# Patient Record
Sex: Male | Born: 1955
Health system: Southern US, Community
[De-identification: ages and names within clinical notes are randomized; demographics above are authoritative.]

## PROBLEM LIST (undated history)

## (undated) DIAGNOSIS — E079 Disorder of thyroid, unspecified: Secondary | ICD-10-CM

## (undated) DIAGNOSIS — Z86010 Personal history of colonic polyps: Secondary | ICD-10-CM

## (undated) DIAGNOSIS — G4730 Sleep apnea, unspecified: Secondary | ICD-10-CM

## (undated) DIAGNOSIS — H359 Unspecified retinal disorder: Secondary | ICD-10-CM

## (undated) DIAGNOSIS — E119 Type 2 diabetes mellitus without complications: Secondary | ICD-10-CM

## (undated) DIAGNOSIS — K649 Unspecified hemorrhoids: Secondary | ICD-10-CM

## (undated) DIAGNOSIS — D649 Anemia, unspecified: Secondary | ICD-10-CM

## (undated) DIAGNOSIS — K219 Gastro-esophageal reflux disease without esophagitis: Secondary | ICD-10-CM

## (undated) DIAGNOSIS — E785 Hyperlipidemia, unspecified: Secondary | ICD-10-CM

## (undated) HISTORY — PX: HERNIA REPAIR: SHX51

## (undated) HISTORY — DX: Gastro-esophageal reflux disease without esophagitis: K21.9

## (undated) HISTORY — PX: COLONOSCOPY: SHX174

## (undated) HISTORY — PX: POLYPECTOMY: SHX149

## (undated) HISTORY — DX: Unspecified hemorrhoids: K64.9

## (undated) HISTORY — DX: Sleep apnea, unspecified: G47.30

## (undated) HISTORY — DX: Personal history of colonic polyps: Z86.010

## (undated) HISTORY — DX: Disorder of thyroid, unspecified: E07.9

## (undated) HISTORY — DX: Unspecified retinal disorder: H35.9

## (undated) HISTORY — DX: Type 2 diabetes mellitus without complications: E11.9

## (undated) HISTORY — PX: LUMBAR LAMINECTOMY: SHX95

## (undated) HISTORY — DX: Hyperlipidemia, unspecified: E78.5

## (undated) HISTORY — DX: Anemia, unspecified: D64.9

---

## 2001-10-08 ENCOUNTER — Encounter: Payer: Self-pay | Admitting: Pulmonary Disease

## 2001-11-03 ENCOUNTER — Encounter: Payer: Self-pay | Admitting: Pulmonary Disease

## 2001-12-11 ENCOUNTER — Encounter: Payer: Self-pay | Admitting: Pulmonary Disease

## 2001-12-11 ENCOUNTER — Ambulatory Visit (HOSPITAL_BASED_OUTPATIENT_CLINIC_OR_DEPARTMENT_OTHER): Admission: RE | Admit: 2001-12-11 | Discharge: 2001-12-11 | Payer: Self-pay | Admitting: Pulmonary Disease

## 2002-01-14 ENCOUNTER — Encounter: Payer: Self-pay | Admitting: Pulmonary Disease

## 2002-01-30 ENCOUNTER — Encounter: Admission: RE | Admit: 2002-01-30 | Discharge: 2002-01-30 | Payer: Self-pay | Admitting: Family Medicine

## 2002-01-30 ENCOUNTER — Encounter: Payer: Self-pay | Admitting: Family Medicine

## 2002-03-17 ENCOUNTER — Encounter: Payer: Self-pay | Admitting: Neurosurgery

## 2002-03-17 ENCOUNTER — Ambulatory Visit (HOSPITAL_COMMUNITY): Admission: RE | Admit: 2002-03-17 | Discharge: 2002-03-18 | Payer: Self-pay | Admitting: Neurosurgery

## 2003-09-07 ENCOUNTER — Ambulatory Visit (HOSPITAL_COMMUNITY): Admission: RE | Admit: 2003-09-07 | Discharge: 2003-09-08 | Payer: Self-pay | Admitting: Neurosurgery

## 2005-02-23 ENCOUNTER — Ambulatory Visit: Payer: Self-pay | Admitting: Internal Medicine

## 2005-03-09 ENCOUNTER — Ambulatory Visit: Payer: Self-pay | Admitting: Internal Medicine

## 2010-07-07 ENCOUNTER — Encounter: Payer: Self-pay | Admitting: Pulmonary Disease

## 2010-07-07 ENCOUNTER — Institutional Professional Consult (permissible substitution) (INDEPENDENT_AMBULATORY_CARE_PROVIDER_SITE_OTHER): Payer: 59 | Admitting: Pulmonary Disease

## 2010-07-07 DIAGNOSIS — G4733 Obstructive sleep apnea (adult) (pediatric): Secondary | ICD-10-CM | POA: Insufficient documentation

## 2010-07-07 DIAGNOSIS — E785 Hyperlipidemia, unspecified: Secondary | ICD-10-CM | POA: Insufficient documentation

## 2010-07-11 NOTE — Assessment & Plan Note (Signed)
Summary: consult for management of osa    Visit Type:  Initial Consult Copy to:  Mosetta Putt Primary Provider/Referring Provider:  Mosetta Putt.   CC:  Sleep Consult.  Former patient of Dr. Shelle Iron. Last seen 2003.  Pt states he has 2 cpap machines but they quit working recently.  Marland Kitchen  History of Present Illness: the pt is a 55y/o male who I have been asked to see for management of osa.  He has a known h/o severe osa from 2003, and was started on cpap successfully at 15cm of pressure.  He has not followed up with me since that time.  He has been wearing cpap compliantly and doing well, until his machine quit working about 3-4 weeks ago.  His machine is 57yrs old, and he is wearing the same mask from 3 yrs ago.  He is getting adequate sleep at night, and was adequately rested while on cpap. Since being off cpap, he has noted worsening sleep and increased daytime fatigue and sleepiness.  He tells me his weight is increased since his last visit.    Preventive Screening-Counseling & Management  Alcohol-Tobacco     Smoking Status: never  Current Medications (verified): 1)  Pravachol 40 Mg Tabs (Pravastatin Sodium) .... Take 1 Tablet By Mouth Once A Day 2)  Methylphenidate Hcl 20 Mg Tabs (Methylphenidate Hcl) .... Take 2 Tabs By Mouth Daily 3)  Citalopram Hydrobromide 40 Mg Tabs (Citalopram Hydrobromide) .... Take 1 Tablet By Mouth Once A Day 4)  Flomax 0.4 Mg Caps (Tamsulosin Hcl) .... Take 1 Tablet By Mouth Once A Day 5)  Multivitamins  Tabs (Multiple Vitamin) .... Take 1 Tablet By Mouth Once A Day  Allergies (verified): No Known Drug Allergies  Past History:  Past Medical History:  HYPERLIPIDEMIA (ICD-272.4) OBSTRUCTIVE SLEEP APNEA (ICD-327.23)--AHI 40/hr in 2003    Past Surgical History: back surgery LLL x 2 2004 and 2005 hernia surgery 1992  Family History: heart disease: father, paternal grandfather  Social History: Patient never smoked.  pt is married and lives with  wife, Orlie Pollen. pt has 1 child: a son pt works as a Administrator, [employer]. Smoking Status:  never  Review of Systems       The patient complains of shortness of breath with activity, productive cough, and nasal congestion/difficulty breathing through nose.  The patient denies shortness of breath at rest, non-productive cough, coughing up blood, chest pain, irregular heartbeats, acid heartburn, indigestion, loss of appetite, weight change, abdominal pain, difficulty swallowing, sore throat, tooth/dental problems, headaches, sneezing, itching, ear ache, anxiety, depression, hand/feet swelling, joint stiffness or pain, rash, change in color of mucus, and fever.    Vital Signs:  Patient profile:   55 year old male Height:      68 inches Weight:      236.50 pounds BMI:     36.09 O2 Sat:      94 % on Room air Temp:     98.4 degrees F oral Pulse rate:   109 / minute BP sitting:   130 / 84  (right arm) Cuff size:   large  Vitals Entered By: Arman Filter LPN (July 06, 1608 1:33 PM)  O2 Flow:  Room air CC: Sleep Consult.  Former patient of Dr. Shelle Iron. Last seen 2003.  Pt states he has 2 cpap machines but they quit working recently.   Comments Medications reviewed with patient Arman Filter LPN  July 07, 9602 1:37 PM    Physical Exam  General:  ow male in nad  Eyes:  PERRLA and EOMI.   Nose:  deviated  septum to left with mild narrowing Mouth:  elongation of soft palate and uvula  Neck:  no jvd, tmg, LN Lungs:  clear to auscultation Heart:  rrr, no mrg Abdomen:  soft and nontender, bs+ Extremities:  mild edema, no cyanosis  pulses intact distally Neurologic:  alert and oriented, moves all 4    Impression & Recommendations:  Problem # 1:  OBSTRUCTIVE SLEEP APNEA (ICD-327.23) the pt has a history of severe osa, and has not lost weight since last visit in 2008.  He has been compliant with his cpap, and has done well until it recently quit working.   He is way overdue for a  new mask and cpap machine.  Will get this set up thru his dme, and have encouraged him to work on weight loss.  He needs to f/u with me on a yearly basis, and as needed.   Medications Added to Medication List This Visit: 1)  Pravachol 40 Mg Tabs (Pravastatin sodium) .... Take 1 tablet by mouth once a day 2)  Methylphenidate Hcl 20 Mg Tabs (Methylphenidate hcl) .... Take 2 tabs by mouth daily 3)  Citalopram Hydrobromide 40 Mg Tabs (Citalopram hydrobromide) .... Take 1 tablet by mouth once a day 4)  Flomax 0.4 Mg Caps (Tamsulosin hcl) .... Take 1 tablet by mouth once a day 5)  Multivitamins Tabs (Multiple vitamin) .... Take 1 tablet by mouth once a day  Other Orders: Consultation Level IV (40347) DME Referral (DME)  Patient Instructions: 1)  will get you a new machine and mask, set at the same pressure. 2)  work on weight loss 3)  followup with me in one year, but call if having issues with cpap.

## 2010-07-18 NOTE — Progress Notes (Signed)
Summary: [facility]   Imported By: Sherian Rein 07/10/2010 07:18:50  _____________________________________________________________________  External Attachment:    Type:   Image     Comment:   External Document

## 2010-07-18 NOTE — Consult Note (Signed)
Summary: [facility]   Imported By: Sherian Rein 07/10/2010 07:17:47  _____________________________________________________________________  External Attachment:    Type:   Image     Comment:   External Document

## 2010-07-19 ENCOUNTER — Telehealth: Payer: Self-pay | Admitting: Pulmonary Disease

## 2010-07-19 NOTE — Telephone Encounter (Signed)
Pt called and states he has not been set up with his new cpap yet. Looks like order was sent 07/07/10. Pt states [facility] has not contacted him or anything.  Will send to pcc to see which [facility] order was sent to. Pt states he informed [facility] needed [facility] [place].  [place], [place]

## 2010-07-19 NOTE — Telephone Encounter (Signed)
msg per LED

## 2010-07-19 NOTE — Telephone Encounter (Signed)
Spoke to pt he is aware that order was re printed and given to [email or web address]  who is our lesion and she will notify the ws office asap [employer]

## 2015-04-11 ENCOUNTER — Encounter: Payer: Self-pay | Admitting: Internal Medicine

## 2015-05-18 MED FILL — METFORMIN HCL ER 500 MG TAB: 500 | 90 days supply | Qty: 180 | Fill #1

## 2015-07-07 MED FILL — CITALOPRAM HBR 40 MG TABLET: 40 | 90 days supply | Qty: 90 | Fill #3

## 2015-07-13 MED FILL — METADATE ER 20 MG TABLET: 20 | 90 days supply | Qty: 90 | Fill #0

## 2015-07-22 ENCOUNTER — Encounter: Payer: Self-pay | Admitting: Internal Medicine

## 2015-07-29 MED FILL — TAMSULOSIN HCL 0.4 MG CAP: 0.4 | 90 days supply | Qty: 90 | Fill #3

## 2015-08-24 MED FILL — PRAVASTATIN SODIUM 80 MG TA: 80 | 90 days supply | Qty: 90 | Fill #3

## 2015-09-30 ENCOUNTER — Ambulatory Visit (AMBULATORY_SURGERY_CENTER): Payer: Self-pay | Admitting: *Deleted

## 2015-09-30 VITALS — Ht 67.0 in | Wt 254.8 lb

## 2015-09-30 DIAGNOSIS — Z1211 Encounter for screening for malignant neoplasm of colon: Secondary | ICD-10-CM

## 2015-09-30 NOTE — Progress Notes (Signed)
No egg or soy allergy known to patient  No issues with past sedation with any surgeries  or procedures, no intubation problems  No diet pills per patient No home 02 use per patient  No blood thinners per patient  Pt denies issues with constipation  Pt is worried about OSA and his b ipap use.  He has a hemorrhoid his PCP suggested mentioning to [facility] about possible cautery to that with colon

## 2015-10-07 MED FILL — CITALOPRAM HBR 40 MG TABLET: 40 | 30 days supply | Qty: 30 | Fill #0

## 2015-10-10 ENCOUNTER — Telehealth: Payer: Self-pay | Admitting: Internal Medicine

## 2015-10-10 NOTE — Telephone Encounter (Signed)
Spoke with patient and we discussed in detail what foods he can have this week, what to avoid and prep instructions rediscussed with him  as well, when to mix, start Thursday and Friday. Questions asnwered Encouraged to call with further questions  Marijean Niemann

## 2015-10-14 ENCOUNTER — Encounter: Payer: Self-pay | Admitting: Internal Medicine

## 2015-10-14 ENCOUNTER — Ambulatory Visit (AMBULATORY_SURGERY_CENTER): Payer: 59 | Admitting: Internal Medicine

## 2015-10-14 VITALS — BP 100/51 | HR 68 | Temp 98.6°F | Resp 20 | Ht 67.0 in | Wt 254.0 lb

## 2015-10-14 DIAGNOSIS — Z1211 Encounter for screening for malignant neoplasm of colon: Secondary | ICD-10-CM

## 2015-10-14 DIAGNOSIS — E119 Type 2 diabetes mellitus without complications: Secondary | ICD-10-CM | POA: Diagnosis not present

## 2015-10-14 DIAGNOSIS — N400 Enlarged prostate without lower urinary tract symptoms: Secondary | ICD-10-CM | POA: Diagnosis not present

## 2015-10-14 DIAGNOSIS — D122 Benign neoplasm of ascending colon: Secondary | ICD-10-CM | POA: Diagnosis not present

## 2015-10-14 DIAGNOSIS — Z86010 Personal history of colonic polyps: Secondary | ICD-10-CM | POA: Insufficient documentation

## 2015-10-14 DIAGNOSIS — F329 Major depressive disorder, single episode, unspecified: Secondary | ICD-10-CM | POA: Diagnosis not present

## 2015-10-14 DIAGNOSIS — G4733 Obstructive sleep apnea (adult) (pediatric): Secondary | ICD-10-CM | POA: Diagnosis not present

## 2015-10-14 LAB — GLUCOSE, CAPILLARY
GLUCOSE-CAPILLARY: 72 mg/dL (ref 65–99)
GLUCOSE-CAPILLARY: 90 mg/dL (ref 65–99)
GLUCOSE-CAPILLARY: 126 mg/dL — AB (ref 65–99)

## 2015-10-14 MED ORDER — SODIUM CHLORIDE 0.9 % IV SOLN: 500.0000 mL | INTRAVENOUS | Status: DC

## 2015-10-14 NOTE — Progress Notes (Signed)
Glucose 90 after receiving D5.  Asymptomatic.

## 2015-10-14 NOTE — Op Note (Signed)
[place] Patient Name: Robert Hendricks Procedure Date: 10/14/2015 11:14 AM MRN: [id number] Endoscopist: Gatha Mayer , MD Age: 60 Referring MD:  Date of Birth: 01-12-1956 Gender: Male Procedure:                Colonoscopy Indications:              Screening for colorectal malignant neoplasm Medicines:                Monitored Anesthesia Care Procedure:                Pre-Anesthesia Assessment:                           - Prior to the procedure, a History and Physical                            was performed, and patient medications and                            allergies were reviewed. The patient's tolerance of                            previous anesthesia was also reviewed. The risks                            and benefits of the procedure and the sedation                            options and risks were discussed with the patient.                            All questions were answered, and informed consent                            was obtained. Prior Anticoagulants: The patient has                            taken aspirin, last dose was 4 days prior to                            procedure. ASA Grade Assessment: II - A patient                            with mild systemic disease. After reviewing the                            risks and benefits, the patient was deemed in                            satisfactory condition to undergo the procedure.                           After obtaining informed consent, the colonoscope  was passed under direct vision. Throughout the                            procedure, the patient's blood pressure, pulse, and                            oxygen saturations were monitored continuously. The                            Model CF-HQ190L [id number]) scope was introduced                            through the anus and advanced to the the cecum,                            identified by appendiceal orifice and  ileocecal                            valve. The ileocecal valve, appendiceal orifice,                            and rectum were photographed. The quality of the                            bowel preparation was excellent. Scope In: 11:22:07 AM Scope Out: 11:33:20 AM Scope Withdrawal Time: 0 hours 9 minutes 10 seconds  Total Procedure Duration: 0 hours 11 minutes 13 seconds  Findings:                 A 10 mm polyp was found in the ascending colon. The                            polyp was sessile. The polyp was removed with a                            cold snare. Resection and retrieval were complete.                           Multiple diverticula were found in the left colon.                           The exam was otherwise without abnormality on                            direct and retroflexion views. Complications:            No immediate complications. Estimated blood loss:                            None. Estimated Blood Loss:     Estimated blood loss was minimal. Impression:               - One 10 mm polyp in the ascending colon, removed  with a cold snare. Resected and retrieved.                           - Severe diverticulosis in the left colon.                           - The examination was otherwise normal on direct                            and retroflexion views. Recommendation:           - Patient has a contact number available for                            emergencies. The signs and symptoms of potential                            delayed complications were discussed with the                            patient. Return to normal activities tomorrow.                            Written discharge instructions were provided to the                            patient.                           - Resume previous diet.                           - Continue present medications.                           - Resume aspirin at prior dose today.                            - Repeat colonoscopy is recommended. The                            colonoscopy date will be determined after pathology                            results from today's exam become available for                            review. Gatha Mayer, MD 10/14/2015 11:52:15 AM This report has been signed electronically.

## 2015-10-14 NOTE — Patient Instructions (Addendum)
I found and removed one polyp - all else ok.  I will let you know pathology results and when to have another routine colonoscopy by mail.  I appreciate the opportunity to care for you. Gatha Mayer, MD, FACG  YOU HAD AN ENDOSCOPIC PROCEDURE TODAY AT [facility]:   Refer to the procedure report that was given to you for any specific questions about what was found during the examination.  If the procedure report does not answer your questions, please call your gastroenterologist to clarify.  If you requested that your care partner not be given the details of your procedure findings, then the procedure report has been included in a sealed envelope for you to review at your convenience later.  YOU SHOULD EXPECT: Some feelings of bloating in the abdomen. Passage of more gas than usual.  Walking can help get rid of the air that was put into your GI tract during the procedure and reduce the bloating. If you had a lower endoscopy (such as a colonoscopy or flexible sigmoidoscopy) you may notice spotting of blood in your stool or on the toilet paper. If you underwent a bowel prep for your procedure, you may not have a normal bowel movement for a few days.  Please Note:  You might notice some irritation and congestion in your nose or some drainage.  This is from the oxygen used during your procedure.  There is no need for concern and it should clear up in a day or so.  SYMPTOMS TO REPORT IMMEDIATELY:   Following lower endoscopy (colonoscopy or flexible sigmoidoscopy):  Excessive amounts of blood in the stool  Significant tenderness or worsening of abdominal pains  Swelling of the abdomen that is new, acute  Fever of 100F or higher  For urgent or emergent issues, a gastroenterologist can be reached at any hour by calling [phone].   DIET: Your first meal following the procedure should be a small meal and then it is ok to progress to your normal diet. Heavy or fried  foods are harder to digest and may make you feel nauseous or bloated.  Likewise, meals heavy in dairy and vegetables can increase bloating.  Drink plenty of fluids but you should avoid alcoholic beverages for 24 hours.  ACTIVITY:  You should plan to take it easy for the rest of today and you should NOT DRIVE or use heavy machinery until tomorrow (because of the sedation medicines used during the test).    FOLLOW UP: Our staff will call the number listed on your records the next business day following your procedure to check on you and address any questions or concerns that you may have regarding the information given to you following your procedure. If we do not reach you, we will leave a message.  However, if you are feeling well and you are not experiencing any problems, there is no need to return our call.  We will assume that you have returned to your regular daily activities without incident.  If any biopsies were taken you will be contacted by phone or by letter within the next 1-3 weeks.  Please call us at [phone] if you have not heard about the biopsies in 3 weeks.    SIGNATURES/CONFIDENTIALITY: You and/or your care partner have signed paperwork which will be entered into your electronic medical record.  These signatures attest to the fact that that the information above on your After Visit Summary has been reviewed and is  understood.  Full responsibility of the confidentiality of this discharge information lies with you and/or your care-partner.  Please read polyp and diverticulosis handouts provided.

## 2015-10-14 NOTE — Progress Notes (Signed)
Called to room to assist during endoscopic procedure.  Patient ID and intended procedure confirmed with present staff. Received instructions for my participation in the procedure from the performing physician.  

## 2015-10-14 NOTE — Progress Notes (Signed)
Report to PACU, RN, vss, BBS= Clear.  

## 2015-10-17 ENCOUNTER — Telehealth: Payer: Self-pay

## 2015-10-17 DIAGNOSIS — Z125 Encounter for screening for malignant neoplasm of prostate: Secondary | ICD-10-CM | POA: Diagnosis not present

## 2015-10-17 DIAGNOSIS — Z0000 Encounter for general adult medical examination without abnormal findings: Secondary | ICD-10-CM | POA: Diagnosis not present

## 2015-10-17 DIAGNOSIS — E7800 Pure hypercholesterolemia, unspecified: Secondary | ICD-10-CM | POA: Diagnosis not present

## 2015-10-17 DIAGNOSIS — E7801 Familial hypercholesterolemia: Secondary | ICD-10-CM | POA: Diagnosis not present

## 2015-10-17 NOTE — Telephone Encounter (Signed)
  Follow up Call-  Call back number 10/14/2015  Post procedure Call Back phone  # [phone]  Permission to leave phone message Yes    Patient was called for follow up after his procedure on 10/14/2015. No answer at the number given for follow up phone call. A message was left on the answering machine.

## 2015-10-28 DIAGNOSIS — F900 Attention-deficit hyperactivity disorder, predominantly inattentive type: Secondary | ICD-10-CM | POA: Diagnosis not present

## 2015-10-28 DIAGNOSIS — E785 Hyperlipidemia, unspecified: Secondary | ICD-10-CM | POA: Diagnosis not present

## 2015-10-28 DIAGNOSIS — F428 Other obsessive-compulsive disorder: Secondary | ICD-10-CM | POA: Diagnosis not present

## 2015-10-28 DIAGNOSIS — R7302 Impaired glucose tolerance (oral): Secondary | ICD-10-CM | POA: Diagnosis not present

## 2015-10-28 DIAGNOSIS — N401 Enlarged prostate with lower urinary tract symptoms: Secondary | ICD-10-CM | POA: Diagnosis not present

## 2015-10-28 DIAGNOSIS — R6882 Decreased libido: Secondary | ICD-10-CM | POA: Diagnosis not present

## 2015-10-28 DIAGNOSIS — G4733 Obstructive sleep apnea (adult) (pediatric): Secondary | ICD-10-CM | POA: Diagnosis not present

## 2015-10-28 DIAGNOSIS — Z0000 Encounter for general adult medical examination without abnormal findings: Secondary | ICD-10-CM | POA: Diagnosis not present

## 2015-10-28 DIAGNOSIS — Z008 Encounter for other general examination: Secondary | ICD-10-CM | POA: Diagnosis not present

## 2015-10-31 MED FILL — TAMSULOSIN HCL 0.4 MG CAP: 0.4 | 90 days supply | Qty: 90 | Fill #0

## 2015-10-31 MED FILL — CITALOPRAM HBR 40 MG TABLET: 40 | 90 days supply | Qty: 90 | Fill #0

## 2015-10-31 MED FILL — METFORMIN HCL ER 500 MG TAB: 500 | 30 days supply | Qty: 90 | Fill #0

## 2015-11-02 ENCOUNTER — Encounter: Payer: Self-pay | Admitting: Internal Medicine

## 2015-11-02 DIAGNOSIS — Z86010 Personal history of colonic polyps: Secondary | ICD-10-CM

## 2015-11-02 HISTORY — DX: Personal history of colonic polyps: Z86.010

## 2015-11-02 MED FILL — METADATE ER 20 MG TABLET: 20 | 30 days supply | Qty: 30 | Fill #0

## 2015-11-02 NOTE — Progress Notes (Signed)
Quick Note:  10 mm adenoma Recall 2020 ______

## 2015-11-10 DIAGNOSIS — E559 Vitamin D deficiency, unspecified: Secondary | ICD-10-CM | POA: Diagnosis not present

## 2015-11-10 DIAGNOSIS — N401 Enlarged prostate with lower urinary tract symptoms: Secondary | ICD-10-CM | POA: Diagnosis not present

## 2015-11-10 DIAGNOSIS — K0499 Other diseases of pulp and periapical tissues: Secondary | ICD-10-CM | POA: Diagnosis not present

## 2015-11-28 MED FILL — PRAVASTATIN SODIUM 80 MG TA: 80 | 30 days supply | Qty: 90 | Fill #0

## 2015-12-26 MED FILL — METADATE ER 20 MG TABLET: 20 | 30 days supply | Qty: 30 | Fill #0

## 2016-02-03 MED FILL — TAMSULOSIN HCL 0.4 MG CAP: 0.4 | 90 days supply | Qty: 90 | Fill #1

## 2016-02-03 MED FILL — METADATE ER 20 MG TABLET: 20 | 30 days supply | Qty: 30 | Fill #0

## 2016-02-14 MED FILL — CITALOPRAM HBR 40 MG TABLET: 40 | 90 days supply | Qty: 90 | Fill #1

## 2016-02-24 MED FILL — METFORMIN HCL ER 500 MG TAB: 500 | 30 days supply | Qty: 90 | Fill #1

## 2016-03-02 MED FILL — PRAVASTATIN SODIUM 80 MG TA: 80 | 30 days supply | Qty: 90 | Fill #1

## 2016-03-16 MED FILL — METHYLPHENIDATE ER 20 MG TA: 20 | 30 days supply | Qty: 30 | Fill #0

## 2016-04-05 DIAGNOSIS — L919 Hypertrophic disorder of the skin, unspecified: Secondary | ICD-10-CM | POA: Diagnosis not present

## 2016-04-05 DIAGNOSIS — L821 Other seborrheic keratosis: Secondary | ICD-10-CM | POA: Diagnosis not present

## 2016-04-05 DIAGNOSIS — L820 Inflamed seborrheic keratosis: Secondary | ICD-10-CM | POA: Diagnosis not present

## 2016-04-05 DIAGNOSIS — D225 Melanocytic nevi of trunk: Secondary | ICD-10-CM | POA: Diagnosis not present

## 2016-04-05 DIAGNOSIS — D1801 Hemangioma of skin and subcutaneous tissue: Secondary | ICD-10-CM | POA: Diagnosis not present

## 2016-05-08 MED FILL — TAMSULOSIN HCL 0.4 MG CAP: 0.4 | 90 days supply | Qty: 90 | Fill #2

## 2016-05-15 MED FILL — METHYLPHENIDATE ER 20 MG TA: 20 | 30 days supply | Qty: 30 | Fill #0

## 2016-05-18 MED FILL — CITALOPRAM HBR 40 MG TABLET: 40 | 90 days supply | Qty: 90 | Fill #2

## 2016-05-31 MED FILL — PRAVASTATIN SODIUM 80 MG TA: 80 | 90 days supply | Qty: 90 | Fill #2

## 2016-05-31 MED FILL — METFORMIN HCL ER 500 MG TAB: 500 | 30 days supply | Qty: 90 | Fill #2

## 2016-06-07 DIAGNOSIS — Z23 Encounter for immunization: Secondary | ICD-10-CM | POA: Diagnosis not present

## 2016-06-29 MED FILL — METHYLPHENIDATE ER 20 MG TA: 20 | 30 days supply | Qty: 30 | Fill #0

## 2016-08-07 MED FILL — METHYLPHENIDATE ER 20 MG TA: 20 | 30 days supply | Qty: 30 | Fill #0

## 2016-08-07 MED FILL — TAMSULOSIN HCL 0.4 MG CAP: 0.4 | 90 days supply | Qty: 90 | Fill #3

## 2016-08-17 MED FILL — CITALOPRAM HBR 40 MG TABLET: 40 | 90 days supply | Qty: 90 | Fill #3

## 2016-08-31 MED FILL — METFORMIN HCL ER 500 MG TAB: 500 | 30 days supply | Qty: 90 | Fill #3

## 2016-09-03 MED FILL — PRAVASTATIN SODIUM 80 MG TA: 80 | 90 days supply | Qty: 90 | Fill #3

## 2016-09-19 MED FILL — METHYLPHENIDATE ER 20 MG TA: 20 | 30 days supply | Qty: 30 | Fill #0

## 2016-10-24 DIAGNOSIS — Z125 Encounter for screening for malignant neoplasm of prostate: Secondary | ICD-10-CM | POA: Diagnosis not present

## 2016-10-24 DIAGNOSIS — Z0000 Encounter for general adult medical examination without abnormal findings: Secondary | ICD-10-CM | POA: Diagnosis not present

## 2016-11-02 DIAGNOSIS — I872 Venous insufficiency (chronic) (peripheral): Secondary | ICD-10-CM | POA: Diagnosis not present

## 2016-11-02 DIAGNOSIS — Z1211 Encounter for screening for malignant neoplasm of colon: Secondary | ICD-10-CM | POA: Diagnosis not present

## 2016-11-02 DIAGNOSIS — Z23 Encounter for immunization: Secondary | ICD-10-CM | POA: Diagnosis not present

## 2016-11-02 DIAGNOSIS — Z0001 Encounter for general adult medical examination with abnormal findings: Secondary | ICD-10-CM | POA: Diagnosis not present

## 2016-11-02 DIAGNOSIS — E7800 Pure hypercholesterolemia, unspecified: Secondary | ICD-10-CM | POA: Diagnosis not present

## 2016-11-02 DIAGNOSIS — R7302 Impaired glucose tolerance (oral): Secondary | ICD-10-CM | POA: Diagnosis not present

## 2016-11-02 DIAGNOSIS — E039 Hypothyroidism, unspecified: Secondary | ICD-10-CM | POA: Diagnosis not present

## 2016-11-02 DIAGNOSIS — G4733 Obstructive sleep apnea (adult) (pediatric): Secondary | ICD-10-CM | POA: Diagnosis not present

## 2016-11-02 DIAGNOSIS — Z008 Encounter for other general examination: Secondary | ICD-10-CM | POA: Diagnosis not present

## 2016-11-02 DIAGNOSIS — N401 Enlarged prostate with lower urinary tract symptoms: Secondary | ICD-10-CM | POA: Diagnosis not present

## 2016-11-05 MED FILL — METFORMIN HCL ER 500 MG TAB: 500 | 30 days supply | Qty: 90 | Fill #0

## 2016-11-05 MED FILL — METHYLPHENIDATE ER 20 MG TA: 20 | 90 days supply | Qty: 90 | Fill #0

## 2016-11-05 MED FILL — PANTOPRAZOLE SOD DR 40 MG T: 40 | 90 days supply | Qty: 90 | Fill #0

## 2016-11-12 MED FILL — TAMSULOSIN HCL 0.4 MG CAP: 0.4 | 90 days supply | Qty: 90 | Fill #0

## 2016-11-12 MED FILL — CITALOPRAM HBR 40 MG TABLET: 40 | 90 days supply | Qty: 90 | Fill #0

## 2016-11-26 MED FILL — PRAVASTATIN SODIUM 80 MG TA: 80 | 90 days supply | Qty: 90 | Fill #0

## 2017-01-15 DIAGNOSIS — E039 Hypothyroidism, unspecified: Secondary | ICD-10-CM | POA: Diagnosis not present

## 2017-02-06 MED FILL — PANTOPRAZOLE SOD DR 40 MG T: 40 | 90 days supply | Qty: 90 | Fill #1

## 2017-02-12 MED FILL — TAMSULOSIN HCL 0.4 MG CAP: 0.4 | 90 days supply | Qty: 90 | Fill #1

## 2017-02-12 MED FILL — CITALOPRAM HBR 40 MG TABLET: 40 | 90 days supply | Qty: 90 | Fill #1

## 2017-03-04 MED FILL — METFORMIN HCL ER 500 MG TAB: 500 | 30 days supply | Qty: 90 | Fill #1

## 2017-03-06 MED FILL — PRAVASTATIN SODIUM 80 MG TA: 80 | 90 days supply | Qty: 90 | Fill #1

## 2017-03-20 MED FILL — METHYLPHENIDATE ER 20 MG TA: 20 | 90 days supply | Qty: 90 | Fill #0

## 2017-04-24 DIAGNOSIS — D1801 Hemangioma of skin and subcutaneous tissue: Secondary | ICD-10-CM | POA: Diagnosis not present

## 2017-04-24 DIAGNOSIS — L918 Other hypertrophic disorders of the skin: Secondary | ICD-10-CM | POA: Diagnosis not present

## 2017-05-15 MED FILL — PANTOPRAZOLE SOD DR 40 MG T: 40 | 90 days supply | Qty: 90 | Fill #2

## 2017-05-15 MED FILL — CITALOPRAM HBR 40 MG TABLET: 40 | 90 days supply | Qty: 90 | Fill #2

## 2017-05-15 MED FILL — TAMSULOSIN HCL 0.4 MG CAP: 0.4 | 90 days supply | Qty: 90 | Fill #2

## 2017-06-12 MED FILL — METFORMIN HCL ER 500 MG TAB: 500 | 30 days supply | Qty: 90 | Fill #2

## 2017-06-12 MED FILL — PRAVASTATIN SODIUM 80 MG TA: 80 | 90 days supply | Qty: 90 | Fill #2

## 2017-07-12 DIAGNOSIS — Z23 Encounter for immunization: Secondary | ICD-10-CM | POA: Diagnosis not present

## 2017-08-05 MED FILL — METHYLPHENIDATE ER 20 MG TA: 20 | 90 days supply | Qty: 90 | Fill #0

## 2017-08-15 MED FILL — PANTOPRAZOLE SOD DR 40 MG T: 40 | 90 days supply | Qty: 90 | Fill #3

## 2017-08-20 MED FILL — CITALOPRAM HBR 40 MG TABLET: 40 | 90 days supply | Qty: 90 | Fill #3

## 2017-08-20 MED FILL — TAMSULOSIN HCL 0.4 MG CAP: 0.4 | 90 days supply | Qty: 90 | Fill #3

## 2017-08-27 DIAGNOSIS — J019 Acute sinusitis, unspecified: Secondary | ICD-10-CM | POA: Diagnosis not present

## 2017-08-27 DIAGNOSIS — J209 Acute bronchitis, unspecified: Secondary | ICD-10-CM | POA: Diagnosis not present

## 2017-08-27 MED FILL — HYDROCODONE-CHLORPHENIRAM S: 10-8 | 6 days supply | Qty: 60 | Fill #0

## 2017-08-27 MED FILL — AZITHROMYCIN 250 MG TABS: 250 | 5 days supply | Qty: 6 | Fill #0

## 2017-08-30 MED FILL — PROAIR RESPICLICK INHAL PWD: 108 (90 BAS | 16 days supply | Qty: 1 | Fill #0

## 2017-09-16 MED FILL — PRAVASTATIN SODIUM 80 MG TA: 80 | 90 days supply | Qty: 90 | Fill #3

## 2017-09-16 MED FILL — METFORMIN HCL ER 500 MG TAB: 500 | 30 days supply | Qty: 90 | Fill #3

## 2017-10-28 DIAGNOSIS — Z1159 Encounter for screening for other viral diseases: Secondary | ICD-10-CM | POA: Diagnosis not present

## 2017-10-28 DIAGNOSIS — Z0000 Encounter for general adult medical examination without abnormal findings: Secondary | ICD-10-CM | POA: Diagnosis not present

## 2017-11-08 MED FILL — LEVOTHYROXINE 100 MCG TAB: 100 | 30 days supply | Qty: 30 | Fill #0

## 2017-11-08 MED FILL — TAMSULOSIN HCL 0.4 MG CAP: 0.4 | 90 days supply | Qty: 180 | Fill #0

## 2017-11-08 MED FILL — METFORMIN HCL ER 500 MG TAB: 500 | 90 days supply | Qty: 90 | Fill #0

## 2017-11-08 MED FILL — CITALOPRAM HBR 40 MG TABLET: 40 | 90 days supply | Qty: 90 | Fill #0

## 2017-11-08 MED FILL — PANTOPRAZOLE SOD DR 40 MG T: 40 | 90 days supply | Qty: 90 | Fill #0

## 2017-12-02 MED FILL — PRAVASTATIN SODIUM 80 MG TA: 80 | 90 days supply | Qty: 90 | Fill #0

## 2017-12-02 MED FILL — METHYLPHENIDATE ER 20 MG TA: 20 | 90 days supply | Qty: 90 | Fill #0

## 2017-12-09 MED FILL — LEVOTHYROXINE 100 MCG TAB: 100 | 30 days supply | Qty: 30 | Fill #1

## 2017-12-16 DIAGNOSIS — E039 Hypothyroidism, unspecified: Secondary | ICD-10-CM | POA: Diagnosis not present

## 2017-12-20 MED FILL — LEVOTHYROXINE 88 MCG TABLET: 88 | 30 days supply | Qty: 30 | Fill #0

## 2018-01-17 MED FILL — LEVOTHYROXINE 88 MCG TABLET: 88 | 30 days supply | Qty: 30 | Fill #1

## 2018-02-17 DIAGNOSIS — E039 Hypothyroidism, unspecified: Secondary | ICD-10-CM | POA: Diagnosis not present

## 2018-02-18 MED FILL — LEVOTHYROXINE 75 MCG TABLET: 75 | 30 days supply | Qty: 30 | Fill #0

## 2018-02-24 MED FILL — PANTOPRAZOLE SOD DR 40 MG T: 40 | 90 days supply | Qty: 90 | Fill #1

## 2018-02-24 MED FILL — CITALOPRAM HBR 40 MG TABLET: 40 | 90 days supply | Qty: 90 | Fill #1

## 2018-03-19 MED FILL — metFORMIN HCL ER 500 MG TB2: 500 | 90 days supply | Qty: 90 | Fill #1

## 2018-03-19 MED FILL — LEVOTHYROXINE 75 MCG TABLET: 75 | 30 days supply | Qty: 30 | Fill #1

## 2018-03-24 MED FILL — PRAVASTATIN SODIUM 80 MG TA: 80 | 90 days supply | Qty: 90 | Fill #1

## 2018-04-01 DIAGNOSIS — M7130 Other bursal cyst, unspecified site: Secondary | ICD-10-CM | POA: Diagnosis not present

## 2018-04-01 DIAGNOSIS — L821 Other seborrheic keratosis: Secondary | ICD-10-CM | POA: Diagnosis not present

## 2018-04-01 DIAGNOSIS — D1801 Hemangioma of skin and subcutaneous tissue: Secondary | ICD-10-CM | POA: Diagnosis not present

## 2018-04-01 DIAGNOSIS — B078 Other viral warts: Secondary | ICD-10-CM | POA: Diagnosis not present

## 2018-04-10 MED FILL — METHYLPHENIDATE HCL ER 20 M: 20 | 90 days supply | Qty: 90 | Fill #0

## 2018-04-15 DIAGNOSIS — E039 Hypothyroidism, unspecified: Secondary | ICD-10-CM | POA: Diagnosis not present

## 2018-04-18 MED FILL — LEVOTHYROXINE 75 MCG TABLET: 75 | 90 days supply | Qty: 90 | Fill #0

## 2018-05-26 MED FILL — TAMSULOSIN HCL 0.4 MG CAP: 0.4 | 90 days supply | Qty: 180 | Fill #1

## 2018-05-26 MED FILL — CITALOPRAM HBR 40 MG TABLET: 40 | 90 days supply | Qty: 90 | Fill #2

## 2018-05-26 MED FILL — PANTOPRAZOLE SOD DR 40 MG T: 40 | 90 days supply | Qty: 90 | Fill #2

## 2018-06-18 MED FILL — metFORMIN HCL ER 500 MG TB2: 500 | 90 days supply | Qty: 90 | Fill #2

## 2018-06-25 MED FILL — PRAVASTATIN SODIUM 80 MG TA: 80 | 90 days supply | Qty: 90 | Fill #2

## 2018-07-14 MED FILL — LEVOTHYROXINE 75 MCG TABLET: 75 | 90 days supply | Qty: 90 | Fill #1

## 2018-08-26 MED FILL — PANTOPRAZOLE SOD DR 40 MG T: 40 | 90 days supply | Qty: 90 | Fill #3

## 2018-08-26 MED FILL — CITALOPRAM HBR 40 MG TABLET: 40 | 90 days supply | Qty: 90 | Fill #3

## 2018-08-27 MED FILL — METHYLPHENIDATE HCL ER 20 M: 20 | 90 days supply | Qty: 90 | Fill #0

## 2018-09-19 ENCOUNTER — Encounter: Payer: Self-pay | Admitting: Internal Medicine

## 2018-09-23 MED FILL — metFORMIN HCL ER 500 MG TB2: 500 | 90 days supply | Qty: 90 | Fill #3

## 2018-09-30 MED FILL — PRAVASTATIN SODIUM 80 MG TA: 80 | 90 days supply | Qty: 90 | Fill #3

## 2018-10-16 MED FILL — LEVOTHYROXINE 75 MCG TABLET: 75 | 90 days supply | Qty: 90 | Fill #2

## 2018-11-19 DIAGNOSIS — Z125 Encounter for screening for malignant neoplasm of prostate: Secondary | ICD-10-CM | POA: Diagnosis not present

## 2018-11-19 DIAGNOSIS — Z0000 Encounter for general adult medical examination without abnormal findings: Secondary | ICD-10-CM | POA: Diagnosis not present

## 2018-11-26 DIAGNOSIS — E7800 Pure hypercholesterolemia, unspecified: Secondary | ICD-10-CM | POA: Diagnosis not present

## 2018-11-26 DIAGNOSIS — I10 Essential (primary) hypertension: Secondary | ICD-10-CM | POA: Diagnosis not present

## 2018-11-26 DIAGNOSIS — F900 Attention-deficit hyperactivity disorder, predominantly inattentive type: Secondary | ICD-10-CM | POA: Diagnosis not present

## 2018-11-26 DIAGNOSIS — N401 Enlarged prostate with lower urinary tract symptoms: Secondary | ICD-10-CM | POA: Diagnosis not present

## 2018-11-26 DIAGNOSIS — Z0000 Encounter for general adult medical examination without abnormal findings: Secondary | ICD-10-CM | POA: Diagnosis not present

## 2018-11-26 DIAGNOSIS — R7302 Impaired glucose tolerance (oral): Secondary | ICD-10-CM | POA: Diagnosis not present

## 2018-11-26 DIAGNOSIS — I872 Venous insufficiency (chronic) (peripheral): Secondary | ICD-10-CM | POA: Diagnosis not present

## 2018-11-26 DIAGNOSIS — G4700 Insomnia, unspecified: Secondary | ICD-10-CM | POA: Diagnosis not present

## 2018-11-27 MED FILL — TAMSULOSIN HCL 0.4 MG CAP: 0.4 | 90 days supply | Qty: 90 | Fill #0

## 2018-11-27 MED FILL — METHYLPHENIDATE HCL ER 20 M: 20 | 90 days supply | Qty: 90 | Fill #0

## 2018-11-27 MED FILL — TRIAMTERENE/HCTZ 37.5/25 TB: 37.5-25 | 90 days supply | Qty: 90 | Fill #0

## 2018-11-27 MED FILL — CITALOPRAM HBR 40 MG TABLET: 40 | 90 days supply | Qty: 90 | Fill #0

## 2018-11-27 MED FILL — PANTOPRAZOLE SOD DR 40 MG T: 40 | 90 days supply | Qty: 90 | Fill #0

## 2018-11-27 MED FILL — ROSUVASTATIN CALCIUM 20 MG: 20 | 90 days supply | Qty: 90 | Fill #0

## 2018-11-27 MED FILL — FINASTERIDE 5 MG TABLET: 5 | 90 days supply | Qty: 90 | Fill #0

## 2018-12-08 DIAGNOSIS — I10 Essential (primary) hypertension: Secondary | ICD-10-CM | POA: Diagnosis not present

## 2018-12-18 MED FILL — metFORMIN HCL ER 500 MG TB2: 500 | 10 days supply | Qty: 30 | Fill #0

## 2018-12-19 ENCOUNTER — Encounter: Payer: Self-pay | Admitting: Pulmonary Disease

## 2018-12-19 ENCOUNTER — Other Ambulatory Visit: Payer: Self-pay

## 2018-12-19 ENCOUNTER — Ambulatory Visit: Payer: 59 | Admitting: Pulmonary Disease

## 2018-12-19 VITALS — BP 146/76 | HR 94 | Temp 97.3°F | Ht 67.0 in | Wt 264.6 lb

## 2018-12-19 DIAGNOSIS — G4733 Obstructive sleep apnea (adult) (pediatric): Secondary | ICD-10-CM

## 2018-12-19 DIAGNOSIS — G4730 Sleep apnea, unspecified: Secondary | ICD-10-CM

## 2018-12-19 DIAGNOSIS — E669 Obesity, unspecified: Secondary | ICD-10-CM

## 2018-12-19 NOTE — Addendum Note (Signed)
Addended by: Jannette Spanner on: 12/19/2018 12:32 PM   Modules accepted: Orders

## 2018-12-19 NOTE — Progress Notes (Signed)
[facility]  Chief Complaint  Patient presents with  . Sleep Consult    Constitutional:  BP (!) 146/76 (BP Location: Right Arm, Cuff Size: Large)   Pulse 94   Temp (!) 97.3 F (36.3 C) (Oral)   Ht 5\' 7"  (1.702 m)   Wt 264 lb 9.6 oz (120 kg)   SpO2 97%   BMI 41.44 kg/m   Past Medical History:  HLD, Colon polyp, DM, Anemia, Hypothyroidism, OCD  Brief Summary:  Robert Hendricks is a 63 y.o. male with obstructive sleep apnea.  Previously seen by Dr. Gwenette Greet.  Sleeps study showed severe OSA.  Started on CPAP 15 cm H2O.  Current machine is about 65 yrs old.  He is concerned that it is no longer working.  He would buy his supplies from [facility] on his own and get a new mask 1 time per year.  His wife thinks he is snoring more even when using CPAP.  His machine reads high pressure leak.  He has gained weight since his original sleep study.  He was also getting leg swelling and started on HCTZ by PCP.  He goes to sleep at 1215 am.  He falls asleep quickly.  He wakes up 2 times to use the bathroom.  He gets out of bed at 7 am.  He feels okay in the morning.  He denies morning headache.  He does not use anything to help him fall sleep.  Has been using ritalin for past several years to help with OCD.  He denies sleep walking, sleep talking, bruxism, or nightmares.  There is no history of restless legs.  He denies sleep hallucinations, sleep paralysis, or cataplexy.  The Epworth score is 3 out of 24.     Physical Exam:   Appearance - well kempt   ENMT - clear nasal mucosa, deviated nasal  septum, no oral exudates, no LAN, trachea midline  Respiratory - normal chest wall, normal respiratory effort, no accessory muscle use, no wheeze/rales  CV - s1s2 regular rate and rhythm, no murmurs, no peripheral edema, radial pulses symmetric  GI - soft, non tender, no masses  Lymph - no adenopathy noted in neck and axillary areas  MSK -  normal gait  Ext - no cyanosis, clubbing, or joint inflammation noted  Skin - no rashes, lesions, or ulcers  Neuro - normal strength, oriented x 3  Psych - normal mood and affect  Discussion:  He has history of obstructive sleep apnea.  He has gained weight and developed leg swelling since original diagnosis.  His machine is more than 65 years old, seems to not be working, and wouldn't be able to be repaired.  It is uncertain whether his current issues are related to needing updated equipment, progression of sleep apnea, or both.  Assessment/Plan:   Snoring with excessive daytime sleepiness with history of obstructive sleep apnea. - will need to arrange for a home sleep study - after results will then arrange for new auto CPAP and overnight oximetry on this set up, and then determine if he needs in lab titration study  Obesity. - discussed how weight can impact sleep and risk for sleep disordered breathing - discussed options to assist with weight loss: combination of diet modification, cardiovascular and strength training exercises  Cardiovascular risk. - had an extensive discussion regarding the adverse health consequences related to untreated sleep disordered breathing - specifically discussed the risks for hypertension, coronary artery disease,  cardiac dysrhythmias, cerebrovascular disease, and diabetes - lifestyle modification discussed  Safe driving practices. - discussed how sleep disruption can increase risk of accidents, particularly when driving - safe driving practices were discussed   Patient Instructions  Will arrange for home sleep study  Follow up in 3 months    Chesley Mires, MD [place] Pager: [phone] 12/19/2018, 9:57 AM  Flow Sheet    Sleep tests:  PSG 10/08/01 >> RDI 40, SpO2 low 79%   Review of Systems:  Reviewed and negative.  Medications:   Allergies as of 12/19/2018   No Known Allergies     Medication List        Accurate as of December 19, 2018  9:57 AM. If you have any questions, ask your nurse or doctor.        aspirin 81 MG chewable tablet Chew 81 mg by mouth daily.   cholecalciferol 1000 units tablet Commonly known as: VITAMIN D Take 1,000 Units by mouth daily.   citalopram 40 MG tablet Commonly known as: CELEXA Take 40 mg by mouth daily.   hydrochlorothiazide 12.5 MG capsule Commonly known as: MICROZIDE Take 12.5 mg by mouth daily.   levothyroxine 75 MCG tablet Commonly known as: SYNTHROID Take 75 mcg by mouth daily before breakfast.   metFORMIN 500 MG tablet Commonly known as: GLUCOPHAGE Take 500 mg by mouth daily with breakfast.   methylphenidate 20 MG tablet Commonly known as: RITALIN Take 20 mg by mouth daily.   pravastatin 80 MG tablet Commonly known as: PRAVACHOL Take 80 mg by mouth daily.   tamsulosin 0.4 MG Caps capsule Commonly known as: FLOMAX Take 0.4 mg by mouth daily.       Past Surgical History:  He  has a past surgical history that includes Hernia repair; Lumbar laminectomy [id number]); and Colonoscopy (2006, 2017).  Family History:  His family history is not on file.  Social History:  He  reports that he has never smoked. He has never used smokeless tobacco. He reports current alcohol use. He reports that he does not use drugs.

## 2018-12-19 NOTE — Patient Instructions (Signed)
Will arrange for home sleep study   Follow up in 3 months 

## 2018-12-19 NOTE — Addendum Note (Signed)
Addended by: Jannette Spanner on: 12/19/2018 10:07 AM   Modules accepted: Orders

## 2018-12-31 ENCOUNTER — Other Ambulatory Visit: Payer: Self-pay

## 2018-12-31 ENCOUNTER — Ambulatory Visit: Payer: 59

## 2018-12-31 DIAGNOSIS — G4733 Obstructive sleep apnea (adult) (pediatric): Secondary | ICD-10-CM

## 2019-01-01 DIAGNOSIS — G4733 Obstructive sleep apnea (adult) (pediatric): Secondary | ICD-10-CM | POA: Diagnosis not present

## 2019-01-06 ENCOUNTER — Telehealth: Payer: Self-pay | Admitting: Pulmonary Disease

## 2019-01-06 DIAGNOSIS — G4733 Obstructive sleep apnea (adult) (pediatric): Secondary | ICD-10-CM

## 2019-01-06 DIAGNOSIS — G4734 Idiopathic sleep related nonobstructive alveolar hypoventilation: Secondary | ICD-10-CM

## 2019-01-06 NOTE — Telephone Encounter (Signed)
Spoke with patient. He is aware of HST results and wishes to proceed with the new cpap machine. Advised him of the process, he verbalized understanding.   Patient wants to hold off on scheduling an office visit for now. He will call back to get scheduled. Will also place a personal reminder on his chart to follow up with him in a few weeks.   Orders will be placed for the CPAP and ONO.   Nothing further needed at time of call.

## 2019-01-06 NOTE — Telephone Encounter (Signed)
HST 01/01/19 >> AHI 57.3, SpO2 low 76%   Please inform him that his sleep study shows very severe obstructive sleep apnea.    Please arrange for new auto CPAP with pressure range 5 to 20 cm H2O with heated humidity and mask of choice.  He will need overnight oximetry while using auto CPAP.  Please schedule ROV in 2 months after getting new CPAP machine.

## 2019-01-12 MED FILL — LEVOTHYROXINE 75 MCG TABLET: 75 | 90 days supply | Qty: 90 | Fill #0

## 2019-01-13 DIAGNOSIS — R0902 Hypoxemia: Secondary | ICD-10-CM | POA: Diagnosis not present

## 2019-01-13 DIAGNOSIS — J449 Chronic obstructive pulmonary disease, unspecified: Secondary | ICD-10-CM | POA: Diagnosis not present

## 2019-01-19 ENCOUNTER — Telehealth: Payer: Self-pay | Admitting: Pulmonary Disease

## 2019-01-19 NOTE — Telephone Encounter (Signed)
lmtcb for pt.  Wcb.

## 2019-01-19 NOTE — Telephone Encounter (Signed)
ONO with CPAP 01/13/19 >> test time 6 hrs 22 min.  Baseline SpO2 93%, low SpO2 85%.  Spent 3 min 36 sec with SpO2 < 88%.   Please let him know oxygen level looked good with CPAP at night.  He should continue using CPAP.

## 2019-01-20 MED FILL — metFORMIN HCL ER 500 MG TB2: 500 | 30 days supply | Qty: 30 | Fill #1

## 2019-01-20 NOTE — Telephone Encounter (Signed)
Pt return called and would like call back

## 2019-01-20 NOTE — Telephone Encounter (Signed)
ATC pt, line went to voicemail. LMTCB x2.  

## 2019-01-20 NOTE — Telephone Encounter (Signed)
Called and spoke w/ pt regarding VS's results and recommendations. Pt verbalized understanding and is inquiring if VS can get an order placed for a new CPAP machine and equipment as his is 12-65 years old. I let him know there is an order placed 01/06/2019 for new CPAP machine; however, additional information (I.e. his sleep test and ONO) may have been needed for DME.   VS, please advise if you are ok with [place] placing an order for new CPAP machine and supplies for pt. Thank you.

## 2019-01-21 NOTE — Telephone Encounter (Signed)
An order was placed on 01/06/19 for new auto CPAP.  He had home sleep study on 01/01/19.  He had face to face visit on 12/19/18 prior to scheduling home sleep study.  This should be sufficient information to arrange for new auto CPAP machine.  Please have [facility] check with [facility] why new auto CPAP hasn't been arranged yet.

## 2019-01-21 NOTE — Telephone Encounter (Signed)
I am calling to check on this

## 2019-01-21 NOTE — Telephone Encounter (Signed)
Called [facility] & spoke to [place].  Somehow this order got voided and stated new machine was received in Feb 2020.  Sonia Baller was able to see that cpap was received in March 2012.  She put a note on the order and sent it back to respiratory dept for pt to be set up and she said she would have someone to call pt today.  I called pt & left him a vm to let him know they would be calling him today & left him my # in case he needs to contact me.  Nothing further needed.

## 2019-02-03 DIAGNOSIS — G4733 Obstructive sleep apnea (adult) (pediatric): Secondary | ICD-10-CM | POA: Diagnosis not present

## 2019-02-10 DIAGNOSIS — E785 Hyperlipidemia, unspecified: Secondary | ICD-10-CM | POA: Diagnosis not present

## 2019-02-17 DIAGNOSIS — N401 Enlarged prostate with lower urinary tract symptoms: Secondary | ICD-10-CM | POA: Diagnosis not present

## 2019-02-17 DIAGNOSIS — E7800 Pure hypercholesterolemia, unspecified: Secondary | ICD-10-CM | POA: Diagnosis not present

## 2019-02-17 DIAGNOSIS — I872 Venous insufficiency (chronic) (peripheral): Secondary | ICD-10-CM | POA: Diagnosis not present

## 2019-02-17 DIAGNOSIS — I10 Essential (primary) hypertension: Secondary | ICD-10-CM | POA: Diagnosis not present

## 2019-02-18 MED FILL — POTASSIUM CHLORIDE CRYS ER: 20 | 90 days supply | Qty: 90 | Fill #0

## 2019-02-18 MED FILL — ROSUVASTATIN CALCIUM 40 MG: 40 | 90 days supply | Qty: 90 | Fill #0

## 2019-02-18 MED FILL — CHLORTHALIDONE 50 MG TABLET: 50 | 90 days supply | Qty: 90 | Fill #0

## 2019-02-26 MED FILL — TAMSULOSIN HCL 0.4 MG CAP: 0.4 | 90 days supply | Qty: 90 | Fill #1

## 2019-02-26 MED FILL — metFORMIN HCL ER 500 MG TB2: 500 | 30 days supply | Qty: 30 | Fill #2

## 2019-02-26 MED FILL — FINASTERIDE 5 MG TABLET: 5 | 90 days supply | Qty: 90 | Fill #1

## 2019-02-26 MED FILL — CITALOPRAM HBR 40 MG TABLET: 40 | 90 days supply | Qty: 90 | Fill #1

## 2019-03-02 MED FILL — PANTOPRAZOLE SOD DR 40 MG T: 40 | 90 days supply | Qty: 90 | Fill #1

## 2019-03-03 DIAGNOSIS — I10 Essential (primary) hypertension: Secondary | ICD-10-CM | POA: Diagnosis not present

## 2019-03-06 DIAGNOSIS — G4733 Obstructive sleep apnea (adult) (pediatric): Secondary | ICD-10-CM | POA: Diagnosis not present

## 2019-03-17 DIAGNOSIS — E876 Hypokalemia: Secondary | ICD-10-CM | POA: Diagnosis not present

## 2019-03-27 MED FILL — POTASSIUM CHLORIDE CRYS ER: 20 MEQ | 90 days supply | Qty: 180 | Fill #0

## 2019-03-27 MED FILL — METFORMIN HCL ER 500 MG TB2: 500 | 90 days supply | Qty: 90 | Fill #3

## 2019-04-05 DIAGNOSIS — G4733 Obstructive sleep apnea (adult) (pediatric): Secondary | ICD-10-CM | POA: Diagnosis not present

## 2019-04-07 DIAGNOSIS — D1801 Hemangioma of skin and subcutaneous tissue: Secondary | ICD-10-CM | POA: Diagnosis not present

## 2019-04-07 DIAGNOSIS — L918 Other hypertrophic disorders of the skin: Secondary | ICD-10-CM | POA: Diagnosis not present

## 2019-04-07 DIAGNOSIS — D225 Melanocytic nevi of trunk: Secondary | ICD-10-CM | POA: Diagnosis not present

## 2019-04-13 MED FILL — LEVOTHYROXINE 75 MCG TABLET: 75 | 90 days supply | Qty: 90 | Fill #1

## 2019-05-06 DIAGNOSIS — G4733 Obstructive sleep apnea (adult) (pediatric): Secondary | ICD-10-CM | POA: Diagnosis not present

## 2019-05-07 DIAGNOSIS — E785 Hyperlipidemia, unspecified: Secondary | ICD-10-CM | POA: Diagnosis not present

## 2019-05-19 DIAGNOSIS — I10 Essential (primary) hypertension: Secondary | ICD-10-CM | POA: Diagnosis not present

## 2019-05-19 DIAGNOSIS — I872 Venous insufficiency (chronic) (peripheral): Secondary | ICD-10-CM | POA: Diagnosis not present

## 2019-05-19 DIAGNOSIS — F900 Attention-deficit hyperactivity disorder, predominantly inattentive type: Secondary | ICD-10-CM | POA: Diagnosis not present

## 2019-05-19 DIAGNOSIS — E7800 Pure hypercholesterolemia, unspecified: Secondary | ICD-10-CM | POA: Diagnosis not present

## 2019-05-20 MED FILL — CHLORTHALIDONE 50 MG TABLET: 50 | 90 days supply | Qty: 90 | Fill #0

## 2019-05-20 MED FILL — ROSUVASTATIN CALCIUM 40 MG: 40 | 90 days supply | Qty: 90 | Fill #0

## 2019-05-20 MED FILL — METHYLPHENIDATE HCL ER 20 M: 20 | 90 days supply | Qty: 90 | Fill #0

## 2019-06-02 MED FILL — CITALOPRAM HBR 40 MG TABLET: 40 | 90 days supply | Qty: 90 | Fill #2

## 2019-06-02 MED FILL — FINASTERIDE 5 MG TABLET: 5 | 90 days supply | Qty: 90 | Fill #2

## 2019-06-02 MED FILL — TAMSULOSIN HCL 0.4 MG CAP: 0.4 | 90 days supply | Qty: 90 | Fill #2

## 2019-06-02 MED FILL — PANTOPRAZOLE SOD DR 40 MG T: 40 | 90 days supply | Qty: 90 | Fill #2

## 2019-06-05 DIAGNOSIS — H349 Unspecified retinal vascular occlusion: Secondary | ICD-10-CM | POA: Diagnosis not present

## 2019-06-05 DIAGNOSIS — H524 Presbyopia: Secondary | ICD-10-CM | POA: Diagnosis not present

## 2019-06-05 DIAGNOSIS — H2513 Age-related nuclear cataract, bilateral: Secondary | ICD-10-CM | POA: Diagnosis not present

## 2019-06-06 DIAGNOSIS — G4733 Obstructive sleep apnea (adult) (pediatric): Secondary | ICD-10-CM | POA: Diagnosis not present

## 2019-06-11 DIAGNOSIS — H43813 Vitreous degeneration, bilateral: Secondary | ICD-10-CM | POA: Diagnosis not present

## 2019-06-11 DIAGNOSIS — H35421 Microcystoid degeneration of retina, right eye: Secondary | ICD-10-CM | POA: Diagnosis not present

## 2019-06-11 DIAGNOSIS — H33192 Other retinoschisis and retinal cysts, left eye: Secondary | ICD-10-CM | POA: Diagnosis not present

## 2019-06-11 DIAGNOSIS — H34832 Tributary (branch) retinal vein occlusion, left eye, with macular edema: Secondary | ICD-10-CM | POA: Diagnosis not present

## 2019-07-02 DIAGNOSIS — H34832 Tributary (branch) retinal vein occlusion, left eye, with macular edema: Secondary | ICD-10-CM | POA: Diagnosis not present

## 2019-07-04 DIAGNOSIS — G4733 Obstructive sleep apnea (adult) (pediatric): Secondary | ICD-10-CM | POA: Diagnosis not present

## 2019-07-06 MED FILL — metFORMIN HCL ER 500 MG TB2: 500 | 90 days supply | Qty: 90 | Fill #4

## 2019-07-06 MED FILL — LEVOTHYROXINE 75 MCG TABLET: 75 | 90 days supply | Qty: 90 | Fill #2

## 2019-07-16 MED FILL — POTASSIUM CHLORIDE CRYS ER: 20 | 90 days supply | Qty: 180 | Fill #1

## 2019-07-30 DIAGNOSIS — H34832 Tributary (branch) retinal vein occlusion, left eye, with macular edema: Secondary | ICD-10-CM | POA: Diagnosis not present

## 2019-07-30 DIAGNOSIS — H33192 Other retinoschisis and retinal cysts, left eye: Secondary | ICD-10-CM | POA: Diagnosis not present

## 2019-07-30 DIAGNOSIS — H35421 Microcystoid degeneration of retina, right eye: Secondary | ICD-10-CM | POA: Diagnosis not present

## 2019-07-30 DIAGNOSIS — H31092 Other chorioretinal scars, left eye: Secondary | ICD-10-CM | POA: Diagnosis not present

## 2019-08-13 DIAGNOSIS — H34832 Tributary (branch) retinal vein occlusion, left eye, with macular edema: Secondary | ICD-10-CM | POA: Diagnosis not present

## 2019-08-13 MED FILL — CHLORTHALIDONE 50 MG TABS: 50 | 90 days supply | Qty: 90 | Fill #0

## 2019-08-27 DIAGNOSIS — H34832 Tributary (branch) retinal vein occlusion, left eye, with macular edema: Secondary | ICD-10-CM | POA: Diagnosis not present

## 2019-08-28 MED FILL — ROSUVASTATIN CALCIUM 40 MG: 40 | 90 days supply | Qty: 90 | Fill #1

## 2019-08-31 ENCOUNTER — Ambulatory Visit: Payer: 59 | Admitting: Pulmonary Disease

## 2019-09-03 MED FILL — TAMSULOSIN HCL 0.4 MG CAP: 0.4 | 90 days supply | Qty: 90 | Fill #3

## 2019-09-03 MED FILL — FINASTERIDE 5 MG TABLET: 5 | 90 days supply | Qty: 90 | Fill #3

## 2019-09-03 MED FILL — CITALOPRAM HBR 40 MG TABLET: 40 | 90 days supply | Qty: 90 | Fill #3

## 2019-09-03 MED FILL — PANTOPRAZOLE SOD DR 40 MG T: 40 | 90 days supply | Qty: 90 | Fill #3

## 2019-09-24 DIAGNOSIS — H33192 Other retinoschisis and retinal cysts, left eye: Secondary | ICD-10-CM | POA: Diagnosis not present

## 2019-09-24 DIAGNOSIS — H35421 Microcystoid degeneration of retina, right eye: Secondary | ICD-10-CM | POA: Diagnosis not present

## 2019-09-24 DIAGNOSIS — H43813 Vitreous degeneration, bilateral: Secondary | ICD-10-CM | POA: Diagnosis not present

## 2019-09-24 DIAGNOSIS — H34832 Tributary (branch) retinal vein occlusion, left eye, with macular edema: Secondary | ICD-10-CM | POA: Diagnosis not present

## 2019-09-30 ENCOUNTER — Ambulatory Visit: Payer: 59 | Admitting: Pulmonary Disease

## 2019-09-30 ENCOUNTER — Other Ambulatory Visit: Payer: Self-pay

## 2019-09-30 ENCOUNTER — Encounter: Payer: Self-pay | Admitting: Pulmonary Disease

## 2019-09-30 VITALS — BP 128/84 | HR 82 | Temp 98.5°F | Ht 67.0 in | Wt 256.4 lb

## 2019-09-30 DIAGNOSIS — E669 Obesity, unspecified: Secondary | ICD-10-CM | POA: Diagnosis not present

## 2019-09-30 DIAGNOSIS — G4733 Obstructive sleep apnea (adult) (pediatric): Secondary | ICD-10-CM | POA: Diagnosis not present

## 2019-09-30 DIAGNOSIS — G4730 Sleep apnea, unspecified: Secondary | ICD-10-CM

## 2019-09-30 MED FILL — metFORMIN HCL ER 500 MG TB2: 500 | 90 days supply | Qty: 90 | Fill #5

## 2019-09-30 MED FILL — METHYLPHENIDATE HCL ER 20 M: 20 | 90 days supply | Qty: 90 | Fill #0

## 2019-09-30 NOTE — Progress Notes (Signed)
[facility]  Chief Complaint  Patient presents with  . Follow-up    pt is here for cpap compliance.pt states working well sleeping well    Constitutional:  BP 128/84 (BP Location: Left Arm, Cuff Size: Normal)   Pulse 82   Temp 98.5 F (36.9 C) (Oral)   Ht 5\' 7"  (1.702 m)   Wt 256 lb 6.4 oz (116.3 kg)   SpO2 96%   BMI 40.16 kg/m   Past Medical History:  HLD, Colon polyp, DM, Anemia, Hypothyroidism, OCD  Brief Summary:  Robert Hendricks is a 64 y.o. male with obstructive sleep apnea.  Subjective:   He has been doing well with CPAP.  Has hybrid mask.  Gets occasional mask leak and sinus congestion.  Trying to adjust his diet to get weight down.   Physical Exam:   Appearance - well kempt   ENMT - no sinus tenderness, deviated septum, no oral exudate, no LAN, Mallampati 3 airway, no stridor  Respiratory - equal breath sounds bilaterally, no wheezing or rales  CV - s1s2 regular rate and rhythm, no murmurs  Ext - no clubbing, no edema  Skin - no rashes  Psych - normal mood and affect  Assessment/Plan:   Obstructive sleep apnea. - he is compliant with CPAP and reports benefit - continue auto CPAP 5 to 20 cm H2O  CPAP rhinitis. - prn nasal irrigation  Obesity. - encouraged him to keep up with weight loss efforts  A total of  22 minutes spent addressing patient care issues on day of visit.  Follow up:   Patient Instructions  Follow up in 1 year   Signature:  Chesley Mires, MD [place] Pager: [phone] 09/30/2019, 4:44 PM  Flow Sheet    Sleep tests:   PSG 10/08/01 >> RDI 40, SpO2 low 79%  HST 01/01/19 >> AHI 57.3, SpO2 low 76%  ONO with CPAP 01/13/19 >> test time 6 hrs 22 min.  Baseline SpO2 93%, low SpO2 85%.  Spent 3 min 36 sec with SpO2 < 88%.  Auto CPAP 08/30/19 to 09/28/19 >> used on 30 of 30 nights with average 6 hrs 53 min.  Average AHI 8.7 with median CPAP 11 and 95 th percentile  CPAP 15 cm H2O  Medications:   Allergies as of 09/30/2019   No Known Allergies     Medication List       Accurate as of September 30, 2019  4:44 PM. If you have any questions, ask your nurse or doctor.        STOP taking these medications   aspirin 81 MG chewable tablet Stopped by: Chesley Mires, MD   finasteride 5 MG tablet Commonly known as: PROSCAR Stopped by: Chesley Mires, MD   hydrochlorothiazide 12.5 MG capsule Commonly known as: MICROZIDE Stopped by: Chesley Mires, MD   pravastatin 80 MG tablet Commonly known as: PRAVACHOL Stopped by: Chesley Mires, MD   triamterene-hydrochlorothiazide 37.5-25 MG tablet Commonly known as: MAXZIDE-25 Stopped by: Chesley Mires, MD     TAKE these medications   cholecalciferol 1000 units tablet Commonly known as: VITAMIN D Take 1,000 Units by mouth daily.   citalopram 40 MG tablet Commonly known as: CELEXA Take 40 mg by mouth daily.   levothyroxine 75 MCG tablet Commonly known as: SYNTHROID Take 75 mcg by mouth daily before breakfast.   metFORMIN 500 MG tablet Commonly known as: GLUCOPHAGE Take 500 mg by mouth daily with breakfast.   metFORMIN 500  MG 24 hr tablet Commonly known as: GLUCOPHAGE-XR   methylphenidate 20 MG tablet Commonly known as: RITALIN Take 20 mg by mouth daily.   tamsulosin 0.4 MG Caps capsule Commonly known as: FLOMAX Take 0.4 mg by mouth daily.       Past Surgical History:  He  has a past surgical history that includes Hernia repair; Lumbar laminectomy [id number]); and Colonoscopy (2006, 2017).  Family History:  His family history is not on file.  Social History:  He  reports that he has never smoked. He has never used smokeless tobacco. He reports current alcohol use. He reports that he does not use drugs.

## 2019-09-30 NOTE — Patient Instructions (Signed)
Follow up in 1 year.

## 2019-10-27 MED FILL — POTASSIUM CHLORIDE CRYS ER: 20 | 30 days supply | Qty: 180 | Fill #0

## 2019-10-29 DIAGNOSIS — H34832 Tributary (branch) retinal vein occlusion, left eye, with macular edema: Secondary | ICD-10-CM | POA: Diagnosis not present

## 2019-11-12 MED FILL — CHLORTHALIDONE 50 MG TABS: 50 | 90 days supply | Qty: 90 | Fill #1

## 2019-11-24 DIAGNOSIS — Z125 Encounter for screening for malignant neoplasm of prostate: Secondary | ICD-10-CM | POA: Diagnosis not present

## 2019-11-24 DIAGNOSIS — Z0000 Encounter for general adult medical examination without abnormal findings: Secondary | ICD-10-CM | POA: Diagnosis not present

## 2019-11-30 MED FILL — ROSUVASTATIN CALCIUM 40 MG: 40 | 90 days supply | Qty: 90 | Fill #2

## 2019-12-01 ENCOUNTER — Other Ambulatory Visit (HOSPITAL_COMMUNITY): Payer: Self-pay | Admitting: Family Medicine

## 2019-12-01 DIAGNOSIS — R7302 Impaired glucose tolerance (oral): Secondary | ICD-10-CM | POA: Diagnosis not present

## 2019-12-01 DIAGNOSIS — E7800 Pure hypercholesterolemia, unspecified: Secondary | ICD-10-CM | POA: Diagnosis not present

## 2019-12-01 DIAGNOSIS — Z0000 Encounter for general adult medical examination without abnormal findings: Secondary | ICD-10-CM | POA: Diagnosis not present

## 2019-12-01 DIAGNOSIS — N401 Enlarged prostate with lower urinary tract symptoms: Secondary | ICD-10-CM | POA: Diagnosis not present

## 2019-12-01 DIAGNOSIS — G4733 Obstructive sleep apnea (adult) (pediatric): Secondary | ICD-10-CM | POA: Diagnosis not present

## 2019-12-01 DIAGNOSIS — I10 Essential (primary) hypertension: Secondary | ICD-10-CM | POA: Diagnosis not present

## 2019-12-01 DIAGNOSIS — I872 Venous insufficiency (chronic) (peripheral): Secondary | ICD-10-CM | POA: Diagnosis not present

## 2019-12-01 DIAGNOSIS — R8281 Pyuria: Secondary | ICD-10-CM | POA: Diagnosis not present

## 2019-12-01 DIAGNOSIS — R319 Hematuria, unspecified: Secondary | ICD-10-CM | POA: Diagnosis not present

## 2019-12-01 DIAGNOSIS — R972 Elevated prostate specific antigen [PSA]: Secondary | ICD-10-CM | POA: Diagnosis not present

## 2019-12-02 DIAGNOSIS — H34832 Tributary (branch) retinal vein occlusion, left eye, with macular edema: Secondary | ICD-10-CM | POA: Diagnosis not present

## 2019-12-02 MED FILL — FINASTERIDE 5 MG TABLET: 5 | 90 days supply | Qty: 90 | Fill #0

## 2019-12-02 MED FILL — TAMSULOSIN HCL 0.4 MG CAP: 0.4 | 90 days supply | Qty: 90 | Fill #0

## 2019-12-02 MED FILL — PANTOPRAZOLE SOD DR 40 MG T: 40 | 90 days supply | Qty: 90 | Fill #0

## 2019-12-02 MED FILL — CITALOPRAM HBR 40 MG TABLET: 40 | 90 days supply | Qty: 90 | Fill #0

## 2019-12-17 DIAGNOSIS — H34832 Tributary (branch) retinal vein occlusion, left eye, with macular edema: Secondary | ICD-10-CM | POA: Diagnosis not present

## 2020-01-06 MED FILL — LEVOTHYROXINE 75 MCG TABLET: 75 | 90 days supply | Qty: 90 | Fill #0

## 2020-01-06 MED FILL — metFORMIN HCL ER 500 MG TB2: 500 | 90 days supply | Qty: 90 | Fill #0

## 2020-01-07 DIAGNOSIS — H34832 Tributary (branch) retinal vein occlusion, left eye, with macular edema: Secondary | ICD-10-CM | POA: Diagnosis not present

## 2020-02-04 DIAGNOSIS — H33192 Other retinoschisis and retinal cysts, left eye: Secondary | ICD-10-CM | POA: Diagnosis not present

## 2020-02-04 DIAGNOSIS — H34832 Tributary (branch) retinal vein occlusion, left eye, with macular edema: Secondary | ICD-10-CM | POA: Diagnosis not present

## 2020-02-09 MED FILL — POTASSIUM CHLORIDE CRYS ER: 20 | 90 days supply | Qty: 180 | Fill #0

## 2020-02-09 MED FILL — CHLORTHALIDONE 50 MG TABS: 50 | 90 days supply | Qty: 90 | Fill #2

## 2020-02-22 ENCOUNTER — Encounter: Payer: Self-pay | Admitting: Internal Medicine

## 2020-02-24 ENCOUNTER — Other Ambulatory Visit (HOSPITAL_COMMUNITY): Payer: Self-pay

## 2020-02-24 MED FILL — IBUPROFEN 800 MG TAB: 800 | 10 days supply | Qty: 30 | Fill #0

## 2020-02-24 MED FILL — CHLORHEXIDINE 0.12% RINSE: 0.12 | 16 days supply | Qty: 473 | Fill #0

## 2020-02-24 MED FILL — traMADol HCL 50 MG TABS: 50 | 4 days supply | Qty: 12 | Fill #0

## 2020-03-02 MED FILL — ROSUVASTATIN CALCIUM 40 MG: 40 | 90 days supply | Qty: 90 | Fill #0

## 2020-03-02 MED FILL — METHYLPHENIDATE HCL ER 20 M: 20 | 90 days supply | Qty: 90 | Fill #0

## 2020-03-03 DIAGNOSIS — H34832 Tributary (branch) retinal vein occlusion, left eye, with macular edema: Secondary | ICD-10-CM | POA: Diagnosis not present

## 2020-03-07 DIAGNOSIS — R972 Elevated prostate specific antigen [PSA]: Secondary | ICD-10-CM | POA: Diagnosis not present

## 2020-03-10 MED FILL — CITALOPRAM HBR 40 MG TABLET: 40 | 90 days supply | Qty: 90 | Fill #1

## 2020-03-10 MED FILL — FINASTERIDE 5 MG TABLET: 5 | 90 days supply | Qty: 90 | Fill #1

## 2020-03-10 MED FILL — TAMSULOSIN HCL 0.4 MG CAP: 0.4 | 90 days supply | Qty: 90 | Fill #1

## 2020-04-05 ENCOUNTER — Other Ambulatory Visit (HOSPITAL_COMMUNITY): Payer: Self-pay | Admitting: Internal Medicine

## 2020-04-05 ENCOUNTER — Ambulatory Visit (AMBULATORY_SURGERY_CENTER): Payer: Self-pay | Admitting: *Deleted

## 2020-04-05 ENCOUNTER — Other Ambulatory Visit: Payer: Self-pay

## 2020-04-05 VITALS — Ht 67.0 in | Wt 257.0 lb

## 2020-04-05 DIAGNOSIS — Z86010 Personal history of colonic polyps: Secondary | ICD-10-CM

## 2020-04-05 MED ORDER — SUPREP BOWEL PREP KIT 17.5-3.13-1.6 GM/177ML PO SOLN: 1.0000 | Freq: Once | ORAL | 0 refills | Status: AC

## 2020-04-05 MED FILL — LEVOTHYROXINE 75 MCG TABLET: 75 | 90 days supply | Qty: 90 | Fill #1

## 2020-04-05 MED FILL — SUPREP BOWEL PREP KIT: 17.5-3.13-1 | 1 days supply | Qty: 354 | Fill #0

## 2020-04-05 NOTE — Progress Notes (Signed)
Fully vax'd ° °No egg or soy allergy known to patient  °No issues with past sedation with any surgeries or procedures °no intubation problems in the past  °No FH of Malignant Hyperthermia °No diet pills per patient °No home 02 use per patient  °No blood thinners per patient  °Pt denies issues with constipation  °No A fib or A flutter  °EMMI video to pt or via MyChart  °COVID 19 guidelines implemented in PV today with Pt and RN  ° ° °Due to the COVID-19 pandemic we are asking patients to follow these guidelines. Please only bring one care partner. Please be aware that your care partner may wait in the car in the parking lot or if they feel like they will be too hot to wait in the car, they may wait in the lobby on the 4th floor. All care partners are required to wear a mask the entire time (we do not have any that we can provide them), they need to practice social distancing, and we will do a Covid check for all patient's and care partners when you arrive. Also we will check their temperature and your temperature. If the care partner waits in their car they need to stay in the parking lot the entire time and we will call them on their cell phone when the patient is ready for discharge so they can bring the car to the front of the building. Also all patient's will need to wear a mask into building. ° °

## 2020-04-06 ENCOUNTER — Encounter: Payer: Self-pay | Admitting: Internal Medicine

## 2020-04-06 DIAGNOSIS — H35421 Microcystoid degeneration of retina, right eye: Secondary | ICD-10-CM | POA: Diagnosis not present

## 2020-04-06 DIAGNOSIS — H31092 Other chorioretinal scars, left eye: Secondary | ICD-10-CM | POA: Diagnosis not present

## 2020-04-06 DIAGNOSIS — H33192 Other retinoschisis and retinal cysts, left eye: Secondary | ICD-10-CM | POA: Diagnosis not present

## 2020-04-06 DIAGNOSIS — H34832 Tributary (branch) retinal vein occlusion, left eye, with macular edema: Secondary | ICD-10-CM | POA: Diagnosis not present

## 2020-04-13 MED FILL — metFORMIN HCL ER 500 MG TB2: 500 | 90 days supply | Qty: 90 | Fill #1

## 2020-04-19 ENCOUNTER — Encounter: Payer: Self-pay | Admitting: Internal Medicine

## 2020-04-19 ENCOUNTER — Other Ambulatory Visit: Payer: Self-pay

## 2020-04-19 ENCOUNTER — Ambulatory Visit (AMBULATORY_SURGERY_CENTER): Payer: 59 | Admitting: Internal Medicine

## 2020-04-19 VITALS — BP 119/76 | HR 70 | Temp 98.6°F | Resp 15 | Ht 67.0 in | Wt 257.0 lb

## 2020-04-19 DIAGNOSIS — Z86010 Personal history of colonic polyps: Secondary | ICD-10-CM

## 2020-04-19 DIAGNOSIS — D123 Benign neoplasm of transverse colon: Secondary | ICD-10-CM | POA: Diagnosis not present

## 2020-04-19 MED ORDER — SODIUM CHLORIDE 0.9 % IV SOLN
500.0000 mL | Freq: Once | INTRAVENOUS | Status: DC
Start: 2020-04-19 — End: 2020-04-19

## 2020-04-19 NOTE — Progress Notes (Signed)
To Pacu, VSS. Report to RN.tb 

## 2020-04-19 NOTE — Patient Instructions (Addendum)
I found and removed one tiny polyp that looks benign.  I will let you know pathology results and when to have another routine colonoscopy by mail and/or My Chart.  You also have diverticulosis - thickened muscle rings and pouches in the colon wall. Please read the handout about this condition.  I appreciate the opportunity to care for you. Gatha Mayer, MD, [facility]   Handouts were given to you on polyps and diverticulosis. Your blood sugar was 114 in the recovery room. You may resume your current medications today. Await biopsy results.  May take 2-3 weeks to receive pathology results. Please call if any questions or concerns.    YOU HAD AN ENDOSCOPIC PROCEDURE TODAY AT [facility]:   Refer to the procedure report that was given to you for any specific questions about what was found during the examination.  If the procedure report does not answer your questions, please call your gastroenterologist to clarify.  If you requested that your care partner not be given the details of your procedure findings, then the procedure report has been included in a sealed envelope for you to review at your convenience later.  YOU SHOULD EXPECT: Some feelings of bloating in the abdomen. Passage of more gas than usual.  Walking can help get rid of the air that was put into your GI tract during the procedure and reduce the bloating. If you had a lower endoscopy (such as a colonoscopy or flexible sigmoidoscopy) you may notice spotting of blood in your stool or on the toilet paper. If you underwent a bowel prep for your procedure, you may not have a normal bowel movement for a few days.  Please Note:  You might notice some irritation and congestion in your nose or some drainage.  This is from the oxygen used during your procedure.  There is no need for concern and it should clear up in a day or so.  SYMPTOMS TO REPORT IMMEDIATELY:   Following lower endoscopy (colonoscopy or flexible  sigmoidoscopy):  Excessive amounts of blood in the stool  Significant tenderness or worsening of abdominal pains  Swelling of the abdomen that is new, acute  Fever of 100F or higher   For urgent or emergent issues, a gastroenterologist can be reached at any hour by calling [phone]. Do not use MyChart messaging for urgent concerns.    DIET:  We do recommend a small meal at first, but then you may proceed to your regular diet.  Drink plenty of fluids but you should avoid alcoholic beverages for 24 hours.  ACTIVITY:  You should plan to take it easy for the rest of today and you should NOT DRIVE or use heavy machinery until tomorrow (because of the sedation medicines used during the test).    FOLLOW UP: Our staff will call the number listed on your records 48-72 hours following your procedure to check on you and address any questions or concerns that you may have regarding the information given to you following your procedure. If we do not reach you, we will leave a message.  We will attempt to reach you two times.  During this call, we will ask if you have developed any symptoms of COVID 19. If you develop any symptoms (ie: fever, flu-like symptoms, shortness of breath, cough etc.) before then, please call [phone].  If you test positive for Covid 19 in the 2 weeks post procedure, please call and report this information to [place].  If any biopsies were taken you will be contacted by phone or by letter within the next 1-3 weeks.  Please call us at [phone] if you have not heard about the biopsies in 3 weeks.    SIGNATURES/CONFIDENTIALITY: You and/or your care partner have signed paperwork which will be entered into your electronic medical record.  These signatures attest to the fact that that the information above on your After Visit Summary has been reviewed and is understood.  Full responsibility of the confidentiality of this discharge information lies with you and/or your  care-partner.

## 2020-04-19 NOTE — Progress Notes (Signed)
No problems noted in the recovery room. maw 

## 2020-04-19 NOTE — Progress Notes (Signed)
Pt's states no medical or surgical changes since previsit or office visit.  VS CW  

## 2020-04-19 NOTE — Progress Notes (Signed)
Called to room to assist during endoscopic procedure.  Patient ID and intended procedure confirmed with present staff. Received instructions for my participation in the procedure from the performing physician.  

## 2020-04-19 NOTE — Op Note (Signed)
[place] Patient Name: Robert Hendricks Procedure Date: 04/19/2020 10:13 AM MRN: [id number] Endoscopist: Gatha Mayer , MD Age: 64 Referring MD:  Date of Birth: Jan 09, 1956 Gender: Male Account #: [id number] Procedure:                Colonoscopy Indications:              Surveillance: Personal history of adenomatous                            polyps on last colonoscopy > 3 years ago Medicines:                Propofol per Anesthesia, Monitored Anesthesia Care Procedure:                Pre-Anesthesia Assessment:                           - Prior to the procedure, a History and Physical                            was performed, and patient medications and                            allergies were reviewed. The patient's tolerance of                            previous anesthesia was also reviewed. The risks                            and benefits of the procedure and the sedation                            options and risks were discussed with the patient.                            All questions were answered, and informed consent                            was obtained. Prior Anticoagulants: The patient has                            taken no previous anticoagulant or antiplatelet                            agents. ASA Grade Assessment: III - A patient with                            severe systemic disease. After reviewing the risks                            and benefits, the patient was deemed in                            satisfactory condition to undergo the procedure.  After obtaining informed consent, the colonoscope                            was passed under direct vision. Throughout the                            procedure, the patient's blood pressure, pulse, and                            oxygen saturations were monitored continuously. The                            Olympus CF-HQ190 [id number]) Colonoscope was                             introduced through the anus and advanced to the the                            cecum, identified by appendiceal orifice and                            ileocecal valve. The colonoscopy was somewhat                            difficult due to significant looping. Successful                            completion of the procedure was aided by applying                            abdominal pressure. The patient tolerated the                            procedure well. The quality of the bowel                            preparation was adequate. The bowel preparation                            used was Miralax via split dose instruction. The                            ileocecal valve, appendiceal orifice, and rectum                            were photographed. Scope In: 10:44:17 AM Scope Out: [id number] AM Scope Withdrawal Time: 0 hours 18 minutes 25 seconds  Total Procedure Duration: 0 hours 21 minutes 50 seconds  Findings:                 The perianal and digital rectal examinations were                            normal. Pertinent negatives include normal prostate                            (  size, shape, and consistency).                           A diminutive polyp was found in the transverse                            colon. The polyp was flat. The polyp was removed                            with a cold snare. Resection and retrieval were                            complete. Verification of patient identification                            for the specimen was done. Estimated blood loss was                            minimal.                           Multiple diverticula were found in the sigmoid                            colon and descending colon.                           The exam was otherwise without abnormality on                            direct and retroflexion views. Complications:            No immediate complications. Estimated Blood Loss:     Estimated blood loss was  minimal. Impression:               - One diminutive polyp in the transverse colon,                            removed with a cold snare. Resected and retrieved.                           - Diverticulosis in the sigmoid colon and in the                            descending colon.                           - The examination was otherwise normal on direct                            and retroflexion views.                           - Personal history of colonic polyp 10 mm adenoma  2017. Recommendation:           - Patient has a contact number available for                            emergencies. The signs and symptoms of potential                            delayed complications were discussed with the                            patient. Return to normal activities tomorrow.                            Written discharge instructions were provided to the                            patient.                           - Resume previous diet.                           - Continue present medications.                           - Repeat colonoscopy is recommended. The                            colonoscopy date will be determined after pathology                            results from today's exam become available for                            review. Gatha Mayer, MD 04/19/2020 11:15:57 AM This report has been signed electronically.

## 2020-04-21 ENCOUNTER — Telehealth: Payer: Self-pay

## 2020-04-21 NOTE — Telephone Encounter (Signed)
Left message on follow up call. 

## 2020-04-28 ENCOUNTER — Encounter: Payer: Self-pay | Admitting: Internal Medicine

## 2020-04-28 DIAGNOSIS — Z86010 Personal history of colonic polyps: Secondary | ICD-10-CM

## 2020-05-09 DIAGNOSIS — L918 Other hypertrophic disorders of the skin: Secondary | ICD-10-CM | POA: Diagnosis not present

## 2020-05-09 DIAGNOSIS — D225 Melanocytic nevi of trunk: Secondary | ICD-10-CM | POA: Diagnosis not present

## 2020-05-09 DIAGNOSIS — D1801 Hemangioma of skin and subcutaneous tissue: Secondary | ICD-10-CM | POA: Diagnosis not present

## 2020-05-12 DIAGNOSIS — H43811 Vitreous degeneration, right eye: Secondary | ICD-10-CM | POA: Diagnosis not present

## 2020-05-12 DIAGNOSIS — H34832 Tributary (branch) retinal vein occlusion, left eye, with macular edema: Secondary | ICD-10-CM | POA: Diagnosis not present

## 2020-05-19 MED FILL — CHLORTHALIDONE 50 MG TABS: 50 | 90 days supply | Qty: 90 | Fill #0

## 2020-06-06 DIAGNOSIS — R7302 Impaired glucose tolerance (oral): Secondary | ICD-10-CM | POA: Diagnosis not present

## 2020-06-06 DIAGNOSIS — R972 Elevated prostate specific antigen [PSA]: Secondary | ICD-10-CM | POA: Diagnosis not present

## 2020-06-08 MED FILL — PANTOPRAZOLE SOD DR 40 MG T: 40 | 90 days supply | Qty: 90 | Fill #1

## 2020-06-08 MED FILL — ROSUVASTATIN CALCIUM 40 MG: 40 | 90 days supply | Qty: 90 | Fill #1

## 2020-06-08 MED FILL — TAMSULOSIN HCL 0.4 MG CAP: 0.4 | 90 days supply | Qty: 90 | Fill #2

## 2020-06-09 DIAGNOSIS — H34832 Tributary (branch) retinal vein occlusion, left eye, with macular edema: Secondary | ICD-10-CM | POA: Diagnosis not present

## 2020-06-09 DIAGNOSIS — H35033 Hypertensive retinopathy, bilateral: Secondary | ICD-10-CM | POA: Diagnosis not present

## 2020-06-09 DIAGNOSIS — H33192 Other retinoschisis and retinal cysts, left eye: Secondary | ICD-10-CM | POA: Diagnosis not present

## 2020-06-09 DIAGNOSIS — H43813 Vitreous degeneration, bilateral: Secondary | ICD-10-CM | POA: Diagnosis not present

## 2020-06-16 MED FILL — CITALOPRAM HBR 40 MG TABLET: 40 | 90 days supply | Qty: 90 | Fill #2

## 2020-06-16 MED FILL — FINASTERIDE 5 MG TABLET: 5 | 90 days supply | Qty: 90 | Fill #2

## 2020-07-06 MED FILL — LEVOTHYROXINE 75 MCG TABLET: 75 | 90 days supply | Qty: 90 | Fill #2

## 2020-07-07 DIAGNOSIS — H34832 Tributary (branch) retinal vein occlusion, left eye, with macular edema: Secondary | ICD-10-CM | POA: Diagnosis not present

## 2020-07-07 DIAGNOSIS — H43811 Vitreous degeneration, right eye: Secondary | ICD-10-CM | POA: Diagnosis not present

## 2020-07-12 ENCOUNTER — Other Ambulatory Visit (HOSPITAL_COMMUNITY): Payer: Self-pay | Admitting: Family Medicine

## 2020-07-26 MED FILL — metFORMIN HCL ER 500 MG TB2: 500 | 90 days supply | Qty: 90 | Fill #2

## 2020-08-11 DIAGNOSIS — H34832 Tributary (branch) retinal vein occlusion, left eye, with macular edema: Secondary | ICD-10-CM | POA: Diagnosis not present

## 2020-08-11 DIAGNOSIS — H33192 Other retinoschisis and retinal cysts, left eye: Secondary | ICD-10-CM | POA: Diagnosis not present

## 2020-08-11 DIAGNOSIS — H43811 Vitreous degeneration, right eye: Secondary | ICD-10-CM | POA: Diagnosis not present

## 2020-08-11 DIAGNOSIS — H35421 Microcystoid degeneration of retina, right eye: Secondary | ICD-10-CM | POA: Diagnosis not present

## 2020-08-22 ENCOUNTER — Other Ambulatory Visit: Payer: Self-pay | Admitting: Family Medicine

## 2020-08-22 ENCOUNTER — Ambulatory Visit
Admission: RE | Admit: 2020-08-22 | Discharge: 2020-08-22 | Disposition: A | Discharge status: Home / Self Care | Payer: 59 | Source: Ambulatory Visit | Attending: Family Medicine | Admitting: Family Medicine

## 2020-08-22 ENCOUNTER — Other Ambulatory Visit (HOSPITAL_COMMUNITY): Payer: Self-pay

## 2020-08-22 DIAGNOSIS — R053 Chronic cough: Secondary | ICD-10-CM | POA: Diagnosis not present

## 2020-08-22 DIAGNOSIS — R059 Cough, unspecified: Secondary | ICD-10-CM | POA: Diagnosis not present

## 2020-08-22 DIAGNOSIS — I700 Atherosclerosis of aorta: Secondary | ICD-10-CM | POA: Diagnosis not present

## 2020-08-22 MED ORDER — PROAIR RESPICLICK 108 (90 BASE) MCG/ACT IN AEPB
INHALATION_SPRAY | RESPIRATORY_TRACT | 1 refills | Status: DC
  Filled 2020-08-22 (×2): qty 1, 30d supply, fill #0

## 2020-08-25 ENCOUNTER — Other Ambulatory Visit (HOSPITAL_COMMUNITY): Payer: Self-pay

## 2020-08-25 MED FILL — Potassium Chloride Microencapsulated Crys ER Tab 20 mEq: ORAL | 90 days supply | Qty: 180 | Fill #0 | Status: AC

## 2020-08-25 MED FILL — Chlorthalidone Tab 50 MG: ORAL | 90 days supply | Qty: 90 | Fill #0 | Status: AC

## 2020-09-14 ENCOUNTER — Other Ambulatory Visit (HOSPITAL_COMMUNITY): Payer: Self-pay

## 2020-09-14 MED FILL — Tamsulosin HCl Cap 0.4 MG: ORAL | 90 days supply | Qty: 90 | Fill #0 | Status: AC

## 2020-09-14 MED FILL — Rosuvastatin Calcium Tab 40 MG: ORAL | 90 days supply | Qty: 90 | Fill #0 | Status: AC

## 2020-09-15 DIAGNOSIS — H43811 Vitreous degeneration, right eye: Secondary | ICD-10-CM | POA: Diagnosis not present

## 2020-09-15 DIAGNOSIS — H34832 Tributary (branch) retinal vein occlusion, left eye, with macular edema: Secondary | ICD-10-CM | POA: Diagnosis not present

## 2020-09-21 ENCOUNTER — Other Ambulatory Visit (HOSPITAL_COMMUNITY): Payer: Self-pay

## 2020-09-21 MED FILL — Citalopram Hydrobromide Tab 40 MG (Base Equiv): ORAL | 90 days supply | Qty: 90 | Fill #0 | Status: AC

## 2020-09-21 MED FILL — Finasteride Tab 5 MG: ORAL | 90 days supply | Qty: 90 | Fill #0 | Status: AC

## 2020-10-03 ENCOUNTER — Other Ambulatory Visit (HOSPITAL_COMMUNITY): Payer: Self-pay

## 2020-10-03 MED FILL — Pantoprazole Sodium EC Tab 40 MG (Base Equiv): ORAL | 90 days supply | Qty: 90 | Fill #0 | Status: AC

## 2020-10-03 MED FILL — Levothyroxine Sodium Tab 75 MCG: ORAL | 90 days supply | Qty: 90 | Fill #0 | Status: AC

## 2020-10-13 DIAGNOSIS — H34832 Tributary (branch) retinal vein occlusion, left eye, with macular edema: Secondary | ICD-10-CM | POA: Diagnosis not present

## 2020-10-13 DIAGNOSIS — H43811 Vitreous degeneration, right eye: Secondary | ICD-10-CM | POA: Diagnosis not present

## 2020-10-28 ENCOUNTER — Other Ambulatory Visit (HOSPITAL_COMMUNITY): Payer: Self-pay

## 2020-10-28 MED FILL — Metformin HCl Tab ER 24HR 500 MG: ORAL | 90 days supply | Qty: 90 | Fill #0 | Status: AC

## 2020-11-16 ENCOUNTER — Other Ambulatory Visit (HOSPITAL_COMMUNITY): Payer: Self-pay

## 2020-11-16 DIAGNOSIS — H34832 Tributary (branch) retinal vein occlusion, left eye, with macular edema: Secondary | ICD-10-CM | POA: Diagnosis not present

## 2020-11-16 MED FILL — Chlorthalidone Tab 50 MG: ORAL | 90 days supply | Qty: 90 | Fill #1 | Status: AC

## 2020-12-01 DIAGNOSIS — Z125 Encounter for screening for malignant neoplasm of prostate: Secondary | ICD-10-CM | POA: Diagnosis not present

## 2020-12-12 DIAGNOSIS — N401 Enlarged prostate with lower urinary tract symptoms: Secondary | ICD-10-CM | POA: Diagnosis not present

## 2020-12-12 DIAGNOSIS — R7302 Impaired glucose tolerance (oral): Secondary | ICD-10-CM | POA: Diagnosis not present

## 2020-12-12 DIAGNOSIS — R972 Elevated prostate specific antigen [PSA]: Secondary | ICD-10-CM | POA: Diagnosis not present

## 2020-12-12 DIAGNOSIS — Z0000 Encounter for general adult medical examination without abnormal findings: Secondary | ICD-10-CM | POA: Diagnosis not present

## 2020-12-12 DIAGNOSIS — I10 Essential (primary) hypertension: Secondary | ICD-10-CM | POA: Diagnosis not present

## 2020-12-12 DIAGNOSIS — R8281 Pyuria: Secondary | ICD-10-CM | POA: Diagnosis not present

## 2020-12-12 DIAGNOSIS — R319 Hematuria, unspecified: Secondary | ICD-10-CM | POA: Diagnosis not present

## 2020-12-12 DIAGNOSIS — J4520 Mild intermittent asthma, uncomplicated: Secondary | ICD-10-CM | POA: Diagnosis not present

## 2020-12-12 DIAGNOSIS — Z23 Encounter for immunization: Secondary | ICD-10-CM | POA: Diagnosis not present

## 2020-12-12 DIAGNOSIS — E7800 Pure hypercholesterolemia, unspecified: Secondary | ICD-10-CM | POA: Diagnosis not present

## 2020-12-13 ENCOUNTER — Other Ambulatory Visit (HOSPITAL_COMMUNITY): Payer: Self-pay

## 2020-12-13 MED ORDER — LEVOTHYROXINE SODIUM 75 MCG PO TABS
ORAL_TABLET | Freq: Every day | ORAL | 3 refills | Status: DC
  Filled 2020-12-28: qty 90, 90d supply, fill #0
  Filled 2021-04-05: qty 90, 90d supply, fill #1
  Filled 2021-07-10: qty 90, 90d supply, fill #2
  Filled 2021-11-01: qty 90, 90d supply, fill #3

## 2020-12-13 MED ORDER — POTASSIUM CHLORIDE CRYS ER 20 MEQ PO TBCR
EXTENDED_RELEASE_TABLET | ORAL | 3 refills | Status: DC
  Filled 2020-12-13: qty 180, 90d supply, fill #0
  Filled 2021-08-14: qty 180, 90d supply, fill #1

## 2020-12-13 MED ORDER — METHYLPHENIDATE HCL ER 20 MG PO TBCR
EXTENDED_RELEASE_TABLET | ORAL | 0 refills | Status: DC
  Filled 2020-12-13: qty 90, 90d supply, fill #0

## 2020-12-13 MED ORDER — CHLORTHALIDONE 50 MG PO TABS
ORAL_TABLET | ORAL | 3 refills | Status: DC
Start: 2020-12-12 — End: 2021-12-23
  Filled 2021-03-06: qty 90, 90d supply, fill #0
  Filled 2021-05-29: qty 90, 90d supply, fill #1
  Filled 2021-09-13: qty 90, 90d supply, fill #2
  Filled 2021-12-04: qty 90, 90d supply, fill #3

## 2020-12-13 MED ORDER — FINASTERIDE 5 MG PO TABS
ORAL_TABLET | Freq: Every day | ORAL | 3 refills | Status: DC
  Filled 2020-12-28: qty 90, 90d supply, fill #0
  Filled 2021-04-05: qty 90, 90d supply, fill #1
  Filled 2021-07-10: qty 90, 90d supply, fill #2
  Filled 2021-10-13: qty 90, 90d supply, fill #3

## 2020-12-13 MED ORDER — METFORMIN HCL ER 500 MG PO TB24
ORAL_TABLET | ORAL | 3 refills | Status: DC
  Filled 2021-02-02: qty 90, 90d supply, fill #0
  Filled 2021-05-10: qty 90, 90d supply, fill #1
  Filled 2021-08-14: qty 90, 90d supply, fill #2
  Filled 2021-11-09: qty 90, 90d supply, fill #3

## 2020-12-13 MED ORDER — CITALOPRAM HYDROBROMIDE 40 MG PO TABS
ORAL_TABLET | ORAL | 3 refills | Status: AC
  Filled 2020-12-13: qty 90, 90d supply, fill #0
  Filled 2021-04-05: qty 90, 90d supply, fill #1
  Filled 2021-07-10: qty 90, 90d supply, fill #2
  Filled 2021-10-13: qty 90, 90d supply, fill #3

## 2020-12-13 MED ORDER — TAMSULOSIN HCL 0.4 MG PO CAPS
ORAL_CAPSULE | ORAL | 3 refills | Status: DC
  Filled 2020-12-13: qty 90, 90d supply, fill #0
  Filled 2021-03-27: qty 90, 90d supply, fill #1
  Filled 2021-07-04: qty 90, 90d supply, fill #2
  Filled 2021-10-04: qty 90, 90d supply, fill #3

## 2020-12-13 MED ORDER — ROSUVASTATIN CALCIUM 40 MG PO TABS
ORAL_TABLET | ORAL | 3 refills | Status: DC
  Filled 2020-12-23: qty 90, 90d supply, fill #0
  Filled 2021-03-27: qty 90, 90d supply, fill #1
  Filled 2021-07-04: qty 90, 90d supply, fill #2
  Filled 2021-10-04: qty 90, 90d supply, fill #3

## 2020-12-13 MED ORDER — PANTOPRAZOLE SODIUM 40 MG PO TBEC
DELAYED_RELEASE_TABLET | ORAL | 3 refills | Status: DC
  Filled 2021-01-26: qty 90, 90d supply, fill #0
  Filled 2021-06-15: qty 90, 90d supply, fill #1
  Filled 2021-10-04: qty 90, 90d supply, fill #2
  Filled 2021-12-04 – 2021-12-15 (×2): qty 90, 90d supply, fill #3

## 2020-12-22 DIAGNOSIS — H34832 Tributary (branch) retinal vein occlusion, left eye, with macular edema: Secondary | ICD-10-CM | POA: Diagnosis not present

## 2020-12-22 DIAGNOSIS — H33192 Other retinoschisis and retinal cysts, left eye: Secondary | ICD-10-CM | POA: Diagnosis not present

## 2020-12-22 DIAGNOSIS — H35033 Hypertensive retinopathy, bilateral: Secondary | ICD-10-CM | POA: Diagnosis not present

## 2020-12-22 DIAGNOSIS — H43813 Vitreous degeneration, bilateral: Secondary | ICD-10-CM | POA: Diagnosis not present

## 2020-12-23 ENCOUNTER — Other Ambulatory Visit (HOSPITAL_COMMUNITY): Payer: Self-pay

## 2020-12-28 ENCOUNTER — Other Ambulatory Visit (HOSPITAL_COMMUNITY): Payer: Self-pay

## 2021-01-19 DIAGNOSIS — H34832 Tributary (branch) retinal vein occlusion, left eye, with macular edema: Secondary | ICD-10-CM | POA: Diagnosis not present

## 2021-01-19 DIAGNOSIS — H43811 Vitreous degeneration, right eye: Secondary | ICD-10-CM | POA: Diagnosis not present

## 2021-01-26 ENCOUNTER — Other Ambulatory Visit (HOSPITAL_COMMUNITY): Payer: Self-pay

## 2021-02-02 ENCOUNTER — Other Ambulatory Visit (HOSPITAL_COMMUNITY): Payer: Self-pay

## 2021-02-23 DIAGNOSIS — H34832 Tributary (branch) retinal vein occlusion, left eye, with macular edema: Secondary | ICD-10-CM | POA: Diagnosis not present

## 2021-02-23 DIAGNOSIS — H524 Presbyopia: Secondary | ICD-10-CM | POA: Diagnosis not present

## 2021-02-24 DIAGNOSIS — H35033 Hypertensive retinopathy, bilateral: Secondary | ICD-10-CM | POA: Diagnosis not present

## 2021-02-24 DIAGNOSIS — H34832 Tributary (branch) retinal vein occlusion, left eye, with macular edema: Secondary | ICD-10-CM | POA: Diagnosis not present

## 2021-02-24 DIAGNOSIS — H43813 Vitreous degeneration, bilateral: Secondary | ICD-10-CM | POA: Diagnosis not present

## 2021-02-24 DIAGNOSIS — H33192 Other retinoschisis and retinal cysts, left eye: Secondary | ICD-10-CM | POA: Diagnosis not present

## 2021-03-06 ENCOUNTER — Other Ambulatory Visit (HOSPITAL_COMMUNITY): Payer: Self-pay

## 2021-03-27 ENCOUNTER — Other Ambulatory Visit (HOSPITAL_COMMUNITY): Payer: Self-pay

## 2021-03-27 DIAGNOSIS — H43392 Other vitreous opacities, left eye: Secondary | ICD-10-CM | POA: Diagnosis not present

## 2021-03-27 DIAGNOSIS — H34832 Tributary (branch) retinal vein occlusion, left eye, with macular edema: Secondary | ICD-10-CM | POA: Diagnosis not present

## 2021-03-27 DIAGNOSIS — H35421 Microcystoid degeneration of retina, right eye: Secondary | ICD-10-CM | POA: Diagnosis not present

## 2021-03-27 DIAGNOSIS — H33192 Other retinoschisis and retinal cysts, left eye: Secondary | ICD-10-CM | POA: Diagnosis not present

## 2021-04-05 ENCOUNTER — Other Ambulatory Visit (HOSPITAL_COMMUNITY): Payer: Self-pay

## 2021-05-04 DIAGNOSIS — H34832 Tributary (branch) retinal vein occlusion, left eye, with macular edema: Secondary | ICD-10-CM | POA: Diagnosis not present

## 2021-05-04 DIAGNOSIS — H35421 Microcystoid degeneration of retina, right eye: Secondary | ICD-10-CM | POA: Diagnosis not present

## 2021-05-04 DIAGNOSIS — H35033 Hypertensive retinopathy, bilateral: Secondary | ICD-10-CM | POA: Diagnosis not present

## 2021-05-04 DIAGNOSIS — H33192 Other retinoschisis and retinal cysts, left eye: Secondary | ICD-10-CM | POA: Diagnosis not present

## 2021-05-10 ENCOUNTER — Other Ambulatory Visit (HOSPITAL_COMMUNITY): Payer: Self-pay

## 2021-05-15 DIAGNOSIS — D225 Melanocytic nevi of trunk: Secondary | ICD-10-CM | POA: Diagnosis not present

## 2021-05-15 DIAGNOSIS — L814 Other melanin hyperpigmentation: Secondary | ICD-10-CM | POA: Diagnosis not present

## 2021-05-29 ENCOUNTER — Other Ambulatory Visit (HOSPITAL_COMMUNITY): Payer: Self-pay

## 2021-06-06 ENCOUNTER — Other Ambulatory Visit (HOSPITAL_COMMUNITY): Payer: Self-pay

## 2021-06-06 MED ORDER — METHYLPHENIDATE HCL ER 20 MG PO TBCR
EXTENDED_RELEASE_TABLET | ORAL | 0 refills | Status: DC
  Filled 2021-06-06: qty 90, 90d supply, fill #0

## 2021-06-07 ENCOUNTER — Other Ambulatory Visit (HOSPITAL_COMMUNITY): Payer: Self-pay

## 2021-06-08 DIAGNOSIS — H34832 Tributary (branch) retinal vein occlusion, left eye, with macular edema: Secondary | ICD-10-CM | POA: Diagnosis not present

## 2021-06-15 ENCOUNTER — Other Ambulatory Visit (HOSPITAL_COMMUNITY): Payer: Self-pay

## 2021-06-15 MED ORDER — CARESTART COVID-19 HOME TEST VI KIT
PACK | 0 refills | Status: AC
  Filled 2021-06-15: qty 4, 4d supply, fill #0

## 2021-07-04 ENCOUNTER — Other Ambulatory Visit (HOSPITAL_COMMUNITY): Payer: Self-pay

## 2021-07-10 ENCOUNTER — Other Ambulatory Visit (HOSPITAL_COMMUNITY): Payer: Self-pay

## 2021-07-13 DIAGNOSIS — H34832 Tributary (branch) retinal vein occlusion, left eye, with macular edema: Secondary | ICD-10-CM | POA: Diagnosis not present

## 2021-07-13 DIAGNOSIS — H43811 Vitreous degeneration, right eye: Secondary | ICD-10-CM | POA: Diagnosis not present

## 2021-08-14 ENCOUNTER — Other Ambulatory Visit (HOSPITAL_COMMUNITY): Payer: Self-pay

## 2021-08-16 DIAGNOSIS — H34832 Tributary (branch) retinal vein occlusion, left eye, with macular edema: Secondary | ICD-10-CM | POA: Diagnosis not present

## 2021-09-13 ENCOUNTER — Other Ambulatory Visit (HOSPITAL_COMMUNITY): Payer: Self-pay

## 2021-09-18 DIAGNOSIS — H43813 Vitreous degeneration, bilateral: Secondary | ICD-10-CM | POA: Diagnosis not present

## 2021-09-18 DIAGNOSIS — H35033 Hypertensive retinopathy, bilateral: Secondary | ICD-10-CM | POA: Diagnosis not present

## 2021-09-18 DIAGNOSIS — H43392 Other vitreous opacities, left eye: Secondary | ICD-10-CM | POA: Diagnosis not present

## 2021-09-18 DIAGNOSIS — H34832 Tributary (branch) retinal vein occlusion, left eye, with macular edema: Secondary | ICD-10-CM | POA: Diagnosis not present

## 2021-10-04 ENCOUNTER — Other Ambulatory Visit (HOSPITAL_COMMUNITY): Payer: Self-pay

## 2021-10-13 ENCOUNTER — Other Ambulatory Visit (HOSPITAL_COMMUNITY): Payer: Self-pay

## 2021-10-13 DIAGNOSIS — H25042 Posterior subcapsular polar age-related cataract, left eye: Secondary | ICD-10-CM | POA: Diagnosis not present

## 2021-10-18 ENCOUNTER — Other Ambulatory Visit (HOSPITAL_COMMUNITY): Payer: Self-pay

## 2021-10-18 DIAGNOSIS — H34832 Tributary (branch) retinal vein occlusion, left eye, with macular edema: Secondary | ICD-10-CM | POA: Diagnosis not present

## 2021-10-18 MED ORDER — METHYLPHENIDATE HCL ER 20 MG PO TBCR
EXTENDED_RELEASE_TABLET | ORAL | 0 refills | Status: DC
  Filled 2021-10-18: qty 90, 90d supply, fill #0

## 2021-10-19 ENCOUNTER — Other Ambulatory Visit (HOSPITAL_COMMUNITY): Payer: Self-pay

## 2021-11-01 ENCOUNTER — Other Ambulatory Visit (HOSPITAL_COMMUNITY): Payer: Self-pay

## 2021-11-08 DIAGNOSIS — H25042 Posterior subcapsular polar age-related cataract, left eye: Secondary | ICD-10-CM | POA: Diagnosis not present

## 2021-11-08 DIAGNOSIS — H268 Other specified cataract: Secondary | ICD-10-CM | POA: Diagnosis not present

## 2021-11-08 DIAGNOSIS — H25012 Cortical age-related cataract, left eye: Secondary | ICD-10-CM | POA: Diagnosis not present

## 2021-11-08 DIAGNOSIS — H269 Unspecified cataract: Secondary | ICD-10-CM | POA: Diagnosis not present

## 2021-11-08 DIAGNOSIS — E119 Type 2 diabetes mellitus without complications: Secondary | ICD-10-CM | POA: Diagnosis not present

## 2021-11-08 DIAGNOSIS — H2512 Age-related nuclear cataract, left eye: Secondary | ICD-10-CM | POA: Diagnosis not present

## 2021-11-09 ENCOUNTER — Other Ambulatory Visit (HOSPITAL_COMMUNITY): Payer: Self-pay

## 2021-11-20 DIAGNOSIS — H34832 Tributary (branch) retinal vein occlusion, left eye, with macular edema: Secondary | ICD-10-CM | POA: Diagnosis not present

## 2021-11-20 DIAGNOSIS — H35033 Hypertensive retinopathy, bilateral: Secondary | ICD-10-CM | POA: Diagnosis not present

## 2021-11-20 DIAGNOSIS — H33192 Other retinoschisis and retinal cysts, left eye: Secondary | ICD-10-CM | POA: Diagnosis not present

## 2021-11-20 DIAGNOSIS — H43813 Vitreous degeneration, bilateral: Secondary | ICD-10-CM | POA: Diagnosis not present

## 2021-12-04 ENCOUNTER — Other Ambulatory Visit (HOSPITAL_COMMUNITY): Payer: Self-pay

## 2021-12-11 DIAGNOSIS — Z008 Encounter for other general examination: Secondary | ICD-10-CM | POA: Diagnosis not present

## 2021-12-11 DIAGNOSIS — C154 Malignant neoplasm of middle third of esophagus: Secondary | ICD-10-CM | POA: Diagnosis not present

## 2021-12-11 DIAGNOSIS — Z125 Encounter for screening for malignant neoplasm of prostate: Secondary | ICD-10-CM | POA: Diagnosis not present

## 2021-12-12 ENCOUNTER — Other Ambulatory Visit (HOSPITAL_COMMUNITY): Payer: Self-pay

## 2021-12-15 ENCOUNTER — Other Ambulatory Visit (HOSPITAL_COMMUNITY): Payer: Self-pay

## 2021-12-18 ENCOUNTER — Other Ambulatory Visit (HOSPITAL_COMMUNITY): Payer: Self-pay

## 2021-12-21 DIAGNOSIS — H34832 Tributary (branch) retinal vein occlusion, left eye, with macular edema: Secondary | ICD-10-CM | POA: Diagnosis not present

## 2021-12-22 DIAGNOSIS — K2960 Other gastritis without bleeding: Secondary | ICD-10-CM | POA: Diagnosis not present

## 2021-12-22 DIAGNOSIS — R7302 Impaired glucose tolerance (oral): Secondary | ICD-10-CM | POA: Diagnosis not present

## 2021-12-22 DIAGNOSIS — N401 Enlarged prostate with lower urinary tract symptoms: Secondary | ICD-10-CM | POA: Diagnosis not present

## 2021-12-22 DIAGNOSIS — I10 Essential (primary) hypertension: Secondary | ICD-10-CM | POA: Diagnosis not present

## 2021-12-22 DIAGNOSIS — Z0000 Encounter for general adult medical examination without abnormal findings: Secondary | ICD-10-CM | POA: Diagnosis not present

## 2021-12-22 DIAGNOSIS — I83813 Varicose veins of bilateral lower extremities with pain: Secondary | ICD-10-CM | POA: Diagnosis not present

## 2021-12-22 DIAGNOSIS — I872 Venous insufficiency (chronic) (peripheral): Secondary | ICD-10-CM | POA: Diagnosis not present

## 2021-12-22 DIAGNOSIS — E7800 Pure hypercholesterolemia, unspecified: Secondary | ICD-10-CM | POA: Diagnosis not present

## 2021-12-22 DIAGNOSIS — G4733 Obstructive sleep apnea (adult) (pediatric): Secondary | ICD-10-CM | POA: Diagnosis not present

## 2021-12-23 ENCOUNTER — Other Ambulatory Visit (HOSPITAL_COMMUNITY): Payer: Self-pay

## 2021-12-23 MED ORDER — POTASSIUM CHLORIDE CRYS ER 20 MEQ PO TBCR
20.0000 meq | EXTENDED_RELEASE_TABLET | Freq: Every day | ORAL | 3 refills | Status: AC
  Filled 2021-12-23: qty 180, 90d supply, fill #0

## 2021-12-23 MED ORDER — CITALOPRAM HYDROBROMIDE 20 MG PO TABS
30.0000 mg | ORAL_TABLET | Freq: Every morning | ORAL | 2 refills | Status: AC
  Filled 2021-12-23: qty 45, 30d supply, fill #0
  Filled 2022-04-05: qty 45, 30d supply, fill #1

## 2021-12-23 MED ORDER — FINASTERIDE 5 MG PO TABS
5.0000 mg | ORAL_TABLET | Freq: Every day | ORAL | 3 refills | Status: AC
  Filled 2022-01-19: qty 90, 90d supply, fill #0
  Filled 2022-04-26: qty 90, 90d supply, fill #1

## 2021-12-23 MED ORDER — METFORMIN HCL ER 500 MG PO TB24
500.0000 mg | ORAL_TABLET | Freq: Every day | ORAL | 3 refills | Status: AC
  Filled 2022-03-08: qty 90, 90d supply, fill #0

## 2021-12-23 MED ORDER — TAMSULOSIN HCL 0.4 MG PO CAPS
0.4000 mg | ORAL_CAPSULE | Freq: Every day | ORAL | 3 refills | Status: AC
  Filled 2022-01-18: qty 90, 90d supply, fill #0
  Filled 2022-04-26: qty 90, 90d supply, fill #1

## 2021-12-23 MED ORDER — METHYLPHENIDATE HCL ER 20 MG PO TBCR
20.0000 mg | EXTENDED_RELEASE_TABLET | Freq: Every morning | ORAL | 0 refills | Status: AC
Start: 2021-12-22 — End: ?
  Filled 2022-02-19: qty 90, 90d supply, fill #0

## 2021-12-23 MED ORDER — PANTOPRAZOLE SODIUM 40 MG PO TBEC
40.0000 mg | DELAYED_RELEASE_TABLET | Freq: Every day | ORAL | 3 refills | Status: AC
  Filled 2022-01-25: qty 90, 90d supply, fill #0

## 2021-12-23 MED ORDER — ROSUVASTATIN CALCIUM 40 MG PO TABS
40.0000 mg | ORAL_TABLET | Freq: Every day | ORAL | 3 refills | Status: AC
  Filled 2022-01-18: qty 90, 90d supply, fill #0
  Filled 2022-04-26: qty 90, 90d supply, fill #1

## 2021-12-23 MED ORDER — LEVOTHYROXINE SODIUM 75 MCG PO TABS
75.0000 ug | ORAL_TABLET | Freq: Every day | ORAL | 3 refills | Status: DC
  Filled 2021-12-23 – 2022-02-19 (×2): qty 90, 90d supply, fill #0

## 2021-12-23 MED ORDER — CHLORTHALIDONE 50 MG PO TABS
50.0000 mg | ORAL_TABLET | Freq: Every morning | ORAL | 3 refills | Status: AC
  Filled 2022-03-29: qty 90, 90d supply, fill #0

## 2021-12-27 DIAGNOSIS — L819 Disorder of pigmentation, unspecified: Secondary | ICD-10-CM | POA: Diagnosis not present

## 2021-12-27 DIAGNOSIS — M79605 Pain in left leg: Secondary | ICD-10-CM | POA: Diagnosis not present

## 2021-12-27 DIAGNOSIS — I8311 Varicose veins of right lower extremity with inflammation: Secondary | ICD-10-CM | POA: Diagnosis not present

## 2021-12-27 DIAGNOSIS — R600 Localized edema: Secondary | ICD-10-CM | POA: Diagnosis not present

## 2021-12-28 ENCOUNTER — Other Ambulatory Visit (HOSPITAL_COMMUNITY): Payer: Self-pay | Admitting: Family Medicine

## 2021-12-28 ENCOUNTER — Ambulatory Visit (HOSPITAL_COMMUNITY): Payer: 59 | Attending: Internal Medicine

## 2021-12-28 DIAGNOSIS — R609 Edema, unspecified: Secondary | ICD-10-CM

## 2021-12-28 DIAGNOSIS — I517 Cardiomegaly: Secondary | ICD-10-CM | POA: Insufficient documentation

## 2021-12-28 DIAGNOSIS — I8311 Varicose veins of right lower extremity with inflammation: Secondary | ICD-10-CM | POA: Diagnosis not present

## 2021-12-28 DIAGNOSIS — Z136 Encounter for screening for cardiovascular disorders: Secondary | ICD-10-CM | POA: Diagnosis not present

## 2021-12-28 LAB — ECHOCARDIOGRAM COMPLETE
Area-P 1/2: 3.77 cm2
Calc EF: 57.2 %
S' Lateral: 3.1 cm
Single Plane A2C EF: 55.2 %
Single Plane A4C EF: 60.5 %

## 2021-12-30 ENCOUNTER — Other Ambulatory Visit (HOSPITAL_COMMUNITY): Payer: Self-pay

## 2022-01-03 ENCOUNTER — Other Ambulatory Visit (HOSPITAL_COMMUNITY): Payer: Self-pay | Admitting: Family Medicine

## 2022-01-03 DIAGNOSIS — R609 Edema, unspecified: Secondary | ICD-10-CM

## 2022-01-18 ENCOUNTER — Other Ambulatory Visit (HOSPITAL_COMMUNITY): Payer: Self-pay

## 2022-01-19 ENCOUNTER — Other Ambulatory Visit (HOSPITAL_COMMUNITY): Payer: Self-pay

## 2022-01-23 DIAGNOSIS — I87393 Chronic venous hypertension (idiopathic) with other complications of bilateral lower extremity: Secondary | ICD-10-CM | POA: Diagnosis not present

## 2022-01-25 ENCOUNTER — Other Ambulatory Visit (HOSPITAL_COMMUNITY): Payer: Self-pay

## 2022-02-01 DIAGNOSIS — H34832 Tributary (branch) retinal vein occlusion, left eye, with macular edema: Secondary | ICD-10-CM | POA: Diagnosis not present

## 2022-02-01 DIAGNOSIS — H43392 Other vitreous opacities, left eye: Secondary | ICD-10-CM | POA: Diagnosis not present

## 2022-02-01 DIAGNOSIS — H43813 Vitreous degeneration, bilateral: Secondary | ICD-10-CM | POA: Diagnosis not present

## 2022-02-01 DIAGNOSIS — H33192 Other retinoschisis and retinal cysts, left eye: Secondary | ICD-10-CM | POA: Diagnosis not present

## 2022-02-16 DIAGNOSIS — L819 Disorder of pigmentation, unspecified: Secondary | ICD-10-CM | POA: Diagnosis not present

## 2022-02-16 DIAGNOSIS — I83893 Varicose veins of bilateral lower extremities with other complications: Secondary | ICD-10-CM | POA: Diagnosis not present

## 2022-02-16 DIAGNOSIS — R600 Localized edema: Secondary | ICD-10-CM | POA: Diagnosis not present

## 2022-02-16 DIAGNOSIS — I8311 Varicose veins of right lower extremity with inflammation: Secondary | ICD-10-CM | POA: Diagnosis not present

## 2022-02-19 ENCOUNTER — Other Ambulatory Visit (HOSPITAL_COMMUNITY): Payer: Self-pay

## 2022-02-20 ENCOUNTER — Other Ambulatory Visit (HOSPITAL_COMMUNITY): Payer: Self-pay

## 2022-03-01 DIAGNOSIS — H34832 Tributary (branch) retinal vein occlusion, left eye, with macular edema: Secondary | ICD-10-CM | POA: Diagnosis not present

## 2022-03-02 DIAGNOSIS — I83891 Varicose veins of right lower extremities with other complications: Secondary | ICD-10-CM | POA: Diagnosis not present

## 2022-03-08 ENCOUNTER — Other Ambulatory Visit (HOSPITAL_COMMUNITY): Payer: Self-pay

## 2022-03-09 DIAGNOSIS — I83892 Varicose veins of left lower extremities with other complications: Secondary | ICD-10-CM | POA: Diagnosis not present

## 2022-03-14 DIAGNOSIS — I83893 Varicose veins of bilateral lower extremities with other complications: Secondary | ICD-10-CM | POA: Diagnosis not present

## 2022-03-15 DIAGNOSIS — H34832 Tributary (branch) retinal vein occlusion, left eye, with macular edema: Secondary | ICD-10-CM | POA: Diagnosis not present

## 2022-03-16 DIAGNOSIS — I83891 Varicose veins of right lower extremities with other complications: Secondary | ICD-10-CM | POA: Diagnosis not present

## 2022-03-21 DIAGNOSIS — I83892 Varicose veins of left lower extremities with other complications: Secondary | ICD-10-CM | POA: Diagnosis not present

## 2022-03-29 ENCOUNTER — Other Ambulatory Visit (HOSPITAL_COMMUNITY): Payer: Self-pay

## 2022-03-30 ENCOUNTER — Other Ambulatory Visit (HOSPITAL_COMMUNITY): Payer: Self-pay

## 2022-03-30 DIAGNOSIS — I87391 Chronic venous hypertension (idiopathic) with other complications of right lower extremity: Secondary | ICD-10-CM | POA: Diagnosis not present

## 2022-03-30 DIAGNOSIS — I8311 Varicose veins of right lower extremity with inflammation: Secondary | ICD-10-CM | POA: Diagnosis not present

## 2022-03-30 DIAGNOSIS — I83891 Varicose veins of right lower extremities with other complications: Secondary | ICD-10-CM | POA: Diagnosis not present

## 2022-04-05 ENCOUNTER — Other Ambulatory Visit (HOSPITAL_COMMUNITY): Payer: Self-pay

## 2022-04-06 DIAGNOSIS — I83892 Varicose veins of left lower extremities with other complications: Secondary | ICD-10-CM | POA: Diagnosis not present

## 2022-04-12 DIAGNOSIS — H34832 Tributary (branch) retinal vein occlusion, left eye, with macular edema: Secondary | ICD-10-CM | POA: Diagnosis not present

## 2022-04-13 DIAGNOSIS — I87391 Chronic venous hypertension (idiopathic) with other complications of right lower extremity: Secondary | ICD-10-CM | POA: Diagnosis not present

## 2022-04-13 DIAGNOSIS — I83891 Varicose veins of right lower extremities with other complications: Secondary | ICD-10-CM | POA: Diagnosis not present

## 2022-04-20 DIAGNOSIS — I83892 Varicose veins of left lower extremities with other complications: Secondary | ICD-10-CM | POA: Diagnosis not present

## 2022-04-20 DIAGNOSIS — I83812 Varicose veins of left lower extremities with pain: Secondary | ICD-10-CM | POA: Diagnosis not present

## 2022-04-25 DIAGNOSIS — I83811 Varicose veins of right lower extremities with pain: Secondary | ICD-10-CM | POA: Diagnosis not present

## 2022-04-25 DIAGNOSIS — I83891 Varicose veins of right lower extremities with other complications: Secondary | ICD-10-CM | POA: Diagnosis not present

## 2022-04-26 ENCOUNTER — Other Ambulatory Visit (HOSPITAL_COMMUNITY): Payer: Self-pay

## 2022-05-03 ENCOUNTER — Other Ambulatory Visit (HOSPITAL_COMMUNITY): Payer: Self-pay

## 2022-05-25 ENCOUNTER — Other Ambulatory Visit (HOSPITAL_COMMUNITY): Payer: Self-pay

## 2022-10-04 ENCOUNTER — Encounter (HOSPITAL_BASED_OUTPATIENT_CLINIC_OR_DEPARTMENT_OTHER): Payer: Self-pay | Admitting: Pulmonary Disease

## 2022-10-04 ENCOUNTER — Ambulatory Visit (INDEPENDENT_AMBULATORY_CARE_PROVIDER_SITE_OTHER): Payer: Medicare Other | Admitting: Pulmonary Disease

## 2022-10-04 VITALS — BP 116/68 | HR 88 | Ht 67.0 in | Wt 264.6 lb

## 2022-10-04 DIAGNOSIS — G4733 Obstructive sleep apnea (adult) (pediatric): Secondary | ICD-10-CM

## 2022-10-04 NOTE — Progress Notes (Signed)
[facility]  Chief Complaint  Patient presents with   Follow-up    Overdue F/U for OSA. States he has been using his machine every night. Wants to discuss mask options.     Past Surgical History:  He  has a past surgical history that includes Hernia repair; Lumbar laminectomy (1610,9604); and Colonoscopy (2006, 2017).  Past Medical History:  HLD, Colon polyp, DM, Anemia, Hypothyroidism, OCD   Constitutional:  BP 116/68   Pulse 88   Ht 5\' 7"  (1.702 m)   Wt 264 lb 8.8 oz (120 kg)   SpO2 96% Comment: on RA  BMI 41.43 kg/m   Brief Summary:  Robert Hendricks is a 67 y.o. male with obstructive sleep apnea.      Subjective:   I last saw him in 2021.   He uses CPAP nightly.  Hasn't received supplies for a while.  His CPAP mask tore a couple weeks ago.  No having terrible mask leak.  This is causing more trouble with his sleep.  He has a relative that uses Bipap and didn't know the difference.  Physical Exam:   Appearance - well kempt   ENMT - no sinus tenderness, no oral exudate, no LAN, Mallampati 3 airway, no stridor, deviated septum  Respiratory - equal breath sounds bilaterally, no wheezing or rales  CV - s1s2 regular rate and rhythm, no murmurs  Ext - no clubbing, no edema  Skin - no rashes  Psych - normal mood and affect   Sleep Tests:  PSG 10/08/01 >> RDI 40, SpO2 low 79% HST 01/01/19 >> AHI 57.3, SpO2 low 76% ONO with CPAP 01/13/19 >> test time 6 hrs 22 min.  Baseline SpO2 93%, low SpO2 85%.  Spent 3 min 36 sec with SpO2 < 88%. Auto CPAP 07/04/22 to 10/01/22 >> used on 90 of 90 nights with average 6 hrs 17 min.  Average AHI 10.8 with median CPAP 9 and 95 th percentile CPAP 11 cm H2O.  Air leak.  Social History:  He  reports that he has never smoked. He has never used smokeless tobacco. He reports current alcohol use. He reports that he does not use drugs.  Family History:  His family history includes Melanoma in his  mother.     Assessment/Plan:   Obstructive sleep apnea. - he is compliant with CPAP and reports benefit from therapy - he uses [employer] for his DME - his current CPAP was ordered September 2020 - continue auto CPAP 5 to 20 cm H2O - will arrange for replacement CPAP mask and supplies - discussed the difference between CPAP and Bipap - reviewed proper sleep hygiene and maintaining a regular sleep-wake schedule  Time Spent Involved in Patient Care on Day of Examination:  25 minutes  Follow up:   Patient Instructions  Will have [employer] arrange for a new CPAP mask and supplies  Follow up in 1 year  Medication List:   Allergies as of 10/04/2022   No Known Allergies      Medication List        Accurate as of October 04, 2022  2:28 PM. If you have any questions, ask your nurse or doctor.          STOP taking these medications    ProAir RespiClick 108 (90 Base) MCG/ACT Aepb Generic drug: Albuterol Sulfate Stopped by: Coralyn Helling, MD       TAKE these medications    Carestart COVID-19 Home Test  Kit Generic drug: COVID-19 At Home Antigen Test Use as directed.   chlorthalidone 50 MG tablet Commonly known as: HYGROTON Take 1 tablet (50 mg total) by mouth in the morning. What changed: Another medication with the same name was removed. Continue taking this medication, and follow the directions you see here. Changed by: Coralyn Helling, MD   cholecalciferol 1000 units tablet Commonly known as: VITAMIN D Take 1,000 Units by mouth daily.   citalopram 40 MG tablet Commonly known as: CELEXA Take 40 mg by mouth daily.   citalopram 40 MG tablet Commonly known as: CELEXA Take 1 tablet by mouth in the morning   citalopram 20 MG tablet Commonly known as: CELEXA Take 1 and 1/2 tablets (30 mg total) by mouth in the morning.   finasteride 5 MG tablet Commonly known as: PROSCAR Take 5 mg by mouth daily.   finasteride 5 MG tablet Commonly known as: PROSCAR Take 1 tablet (5 mg  total) by mouth daily.   levothyroxine 75 MCG tablet Commonly known as: SYNTHROID Take 75 mcg by mouth daily before breakfast. What changed: Another medication with the same name was removed. Continue taking this medication, and follow the directions you see here. Changed by: Coralyn Helling, MD   metFORMIN 500 MG tablet Commonly known as: GLUCOPHAGE Take 500 mg by mouth daily with breakfast. What changed: Another medication with the same name was removed. Continue taking this medication, and follow the directions you see here. Changed by: Coralyn Helling, MD   metFORMIN 500 MG 24 hr tablet Commonly known as: GLUCOPHAGE-XR Take 1 tablet (500 mg total) by mouth daily after meal. What changed: Another medication with the same name was removed. Continue taking this medication, and follow the directions you see here. Changed by: Coralyn Helling, MD   methylphenidate 20 MG ER tablet Commonly known as: METADATE ER Take 1 tablet (20 mg total) by mouth in the morning as needed. What changed: Another medication with the same name was removed. Continue taking this medication, and follow the directions you see here. Changed by: Coralyn Helling, MD   pantoprazole 40 MG tablet Commonly known as: PROTONIX Take 1/2-1 tablet (40 mg total) by mouth daily before breakfast. What changed: Another medication with the same name was removed. Continue taking this medication, and follow the directions you see here. Changed by: Coralyn Helling, MD   potassium chloride SA 20 MEQ tablet Commonly known as: KLOR-CON M Take 20 mEq by mouth 2 (two) times daily. What changed: Another medication with the same name was removed. Continue taking this medication, and follow the directions you see here. Changed by: Coralyn Helling, MD   potassium chloride SA 20 MEQ tablet Commonly known as: KLOR-CON M Take 1 tablet (20 mEq total) by mouth twice daily after meals. What changed: Another medication with the same name was removed. Continue  taking this medication, and follow the directions you see here. Changed by: Coralyn Helling, MD   rosuvastatin 40 MG tablet Commonly known as: CRESTOR Take 1 tablet (40 mg total) by mouth daily after dinner. What changed: Another medication with the same name was removed. Continue taking this medication, and follow the directions you see here. Changed by: Coralyn Helling, MD   tamsulosin 0.4 MG Caps capsule Commonly known as: FLOMAX Take 1 capsule (0.4 mg total) by mouth at bedtime. What changed: Another medication with the same name was removed. Continue taking this medication, and follow the directions you see here. Changed by: Coralyn Helling, MD  Signature:  Coralyn Helling, MD [facility] Pager - [phone] 10/04/2022, 2:28 PM

## 2022-10-04 NOTE — Patient Instructions (Signed)
Will have [employer] arrange for a new CPAP mask and supplies  Follow up in 1 year

## 2023-01-30 ENCOUNTER — Ambulatory Visit
Admission: RE | Admit: 2023-01-30 | Discharge: 2023-01-30 | Disposition: A | Discharge status: Home / Self Care | Payer: Medicare Other | Source: Ambulatory Visit | Attending: Family Medicine | Admitting: Family Medicine

## 2023-01-30 ENCOUNTER — Other Ambulatory Visit: Payer: Self-pay | Admitting: Family Medicine

## 2023-01-30 DIAGNOSIS — R053 Chronic cough: Secondary | ICD-10-CM
